# Patient Record
Sex: Female | Born: 1940 | Race: White | Hispanic: No | Marital: Married | Smoking: Never smoker
Health system: Southern US, Community
[De-identification: ages and names within clinical notes are randomized; demographics above are authoritative.]

## PROBLEM LIST (undated history)

## (undated) DIAGNOSIS — K6389 Other specified diseases of intestine: Secondary | ICD-10-CM

## (undated) DIAGNOSIS — K589 Irritable bowel syndrome without diarrhea: Secondary | ICD-10-CM

## (undated) DIAGNOSIS — K5900 Constipation, unspecified: Secondary | ICD-10-CM

## (undated) DIAGNOSIS — Z86010 Personal history of colonic polyps: Secondary | ICD-10-CM

## (undated) DIAGNOSIS — E7800 Pure hypercholesterolemia, unspecified: Secondary | ICD-10-CM

## (undated) DIAGNOSIS — I639 Cerebral infarction, unspecified: Secondary | ICD-10-CM

## (undated) DIAGNOSIS — I10 Essential (primary) hypertension: Secondary | ICD-10-CM

## (undated) DIAGNOSIS — Z800 Family history of malignant neoplasm of digestive organs: Secondary | ICD-10-CM

## (undated) DIAGNOSIS — F329 Major depressive disorder, single episode, unspecified: Secondary | ICD-10-CM

## (undated) DIAGNOSIS — G4730 Sleep apnea, unspecified: Secondary | ICD-10-CM

## (undated) DIAGNOSIS — M159 Polyosteoarthritis, unspecified: Secondary | ICD-10-CM

## (undated) DIAGNOSIS — F3289 Other specified depressive episodes: Secondary | ICD-10-CM

## (undated) HISTORY — DX: Major depressive disorder, single episode, unspecified: F32.9

## (undated) HISTORY — DX: Constipation, unspecified: K59.00

## (undated) HISTORY — DX: Cerebral infarction, unspecified: I63.9

## (undated) HISTORY — DX: Essential (primary) hypertension: I10

## (undated) HISTORY — PX: VAGINAL HYSTERECTOMY: SHX2639

## (undated) HISTORY — PX: CHOLECYSTECTOMY: SHX55

## (undated) HISTORY — DX: Personal history of colonic polyps: Z86.010

## (undated) HISTORY — DX: Irritable bowel syndrome, unspecified: K58.9

## (undated) HISTORY — DX: Sleep apnea, unspecified: G47.30

## (undated) HISTORY — DX: Other specified depressive episodes: F32.89

## (undated) HISTORY — DX: Other specified diseases of intestine: K63.89

## (undated) HISTORY — DX: Pure hypercholesterolemia, unspecified: E78.00

## (undated) HISTORY — DX: Family history of malignant neoplasm of digestive organs: Z80.0

## (undated) HISTORY — PX: COLON RESECTION: SHX5231

## (undated) HISTORY — DX: Polyosteoarthritis, unspecified: M15.9

---

## 1998-08-02 ENCOUNTER — Other Ambulatory Visit: Admission: RE | Admit: 1998-08-02 | Discharge: 1998-08-02 | Payer: Self-pay | Admitting: Gastroenterology

## 1998-08-02 ENCOUNTER — Encounter (INDEPENDENT_AMBULATORY_CARE_PROVIDER_SITE_OTHER): Payer: Self-pay | Admitting: Specialist

## 2001-08-04 ENCOUNTER — Encounter: Admission: RE | Admit: 2001-08-04 | Discharge: 2001-11-02 | Payer: Self-pay | Admitting: Internal Medicine

## 2005-10-14 DIAGNOSIS — Z86010 Personal history of colonic polyps: Secondary | ICD-10-CM

## 2005-10-14 HISTORY — DX: Personal history of colonic polyps: Z86.010

## 2005-10-29 ENCOUNTER — Ambulatory Visit: Payer: Self-pay | Admitting: Gastroenterology

## 2005-10-30 ENCOUNTER — Encounter (INDEPENDENT_AMBULATORY_CARE_PROVIDER_SITE_OTHER): Payer: Self-pay | Admitting: Specialist

## 2005-10-30 ENCOUNTER — Ambulatory Visit: Payer: Self-pay | Admitting: Gastroenterology

## 2005-11-15 ENCOUNTER — Ambulatory Visit: Payer: Self-pay | Admitting: Gastroenterology

## 2005-11-15 LAB — CONVERTED CEMR LAB
ALT: 22 U/L
AST: 27 U/L
Albumin: 4.3 g/dL
Alkaline Phosphatase: 75 U/L
BUN: 11 mg/dL
Basophils Absolute: 0.1 K/uL
Basophils Relative: 1.5 % — ABNORMAL HIGH
CO2: 26 meq/L
Calcium: 9.5 mg/dL
Chloride: 90 meq/L — ABNORMAL LOW
Creatinine, Ser: 0.9 mg/dL
Eosinophil percent: 2.1 %
Folate: 15.9 ng/mL
GFR calc non Af Amer: 67 mL/min
Glomerular Filtration Rate, Af Am: 81 mL/min/1.73m2
Glucose, Bld: 87 mg/dL
HCT: 36.4 %
Hemoglobin: 12.1 g/dL
Lymphocytes Relative: 36.5 %
MCHC: 33.3 g/dL
MCV: 91.9 fL
Monocytes Absolute: 0.2 K/uL
Monocytes Relative: 7.1 %
Neutro Abs: 1.8 K/uL
Neutrophils Relative %: 52.8 %
Platelets: 264 K/uL
Potassium: 4.4 meq/L
RBC: 3.96 M/uL
RDW: 12.9 %
Sed Rate: 8 mm/h
Sodium: 124 meq/L — ABNORMAL LOW
T4, Total: 4.6 ug/dL — ABNORMAL LOW
TSH: 2.04 u[IU]/mL
Total Bilirubin: 0.8 mg/dL
Total Protein: 7.2 g/dL
Vitamin B-12: 691 pg/mL
WBC: 3.5 10*3/microliter — ABNORMAL LOW

## 2005-11-26 ENCOUNTER — Ambulatory Visit: Payer: Self-pay | Admitting: Gastroenterology

## 2005-12-26 ENCOUNTER — Ambulatory Visit: Payer: Self-pay | Admitting: Gastroenterology

## 2006-04-24 ENCOUNTER — Ambulatory Visit: Payer: Self-pay | Admitting: Gastroenterology

## 2006-04-24 LAB — CONVERTED CEMR LAB
ALT: 22 units/L (ref 0–40)
AST: 31 units/L (ref 0–37)
Basophils Relative: 0.2 % (ref 0.0–1.0)
Bilirubin, Direct: 0.1 mg/dL (ref 0.0–0.3)
CO2: 33 meq/L — ABNORMAL HIGH (ref 19–32)
Calcium: 10.2 mg/dL (ref 8.4–10.5)
Chloride: 99 meq/L (ref 96–112)
Eosinophils Relative: 2.1 % (ref 0.0–5.0)
Glucose, Bld: 90 mg/dL (ref 70–99)
HCT: 36.7 % (ref 36.0–46.0)
Lymphocytes Relative: 27.4 % (ref 12.0–46.0)
Neutro Abs: 2.6 10*3/uL (ref 1.4–7.7)
Platelets: 258 10*3/uL (ref 150–400)
RBC: 4.07 M/uL (ref 3.87–5.11)
Total Protein: 7.6 g/dL (ref 6.0–8.3)
WBC: 4.1 10*3/uL — ABNORMAL LOW (ref 4.5–10.5)

## 2006-10-02 ENCOUNTER — Ambulatory Visit: Payer: Self-pay | Admitting: Gastroenterology

## 2007-01-02 DIAGNOSIS — K6389 Other specified diseases of intestine: Secondary | ICD-10-CM

## 2007-01-02 DIAGNOSIS — I10 Essential (primary) hypertension: Secondary | ICD-10-CM | POA: Insufficient documentation

## 2007-01-02 DIAGNOSIS — E7800 Pure hypercholesterolemia, unspecified: Secondary | ICD-10-CM | POA: Insufficient documentation

## 2007-01-02 DIAGNOSIS — F329 Major depressive disorder, single episode, unspecified: Secondary | ICD-10-CM

## 2007-01-02 DIAGNOSIS — D126 Benign neoplasm of colon, unspecified: Secondary | ICD-10-CM

## 2007-01-02 DIAGNOSIS — K589 Irritable bowel syndrome without diarrhea: Secondary | ICD-10-CM

## 2007-01-02 DIAGNOSIS — K5900 Constipation, unspecified: Secondary | ICD-10-CM

## 2007-01-02 DIAGNOSIS — M159 Polyosteoarthritis, unspecified: Secondary | ICD-10-CM | POA: Insufficient documentation

## 2008-10-05 ENCOUNTER — Encounter (INDEPENDENT_AMBULATORY_CARE_PROVIDER_SITE_OTHER): Payer: Self-pay | Admitting: *Deleted

## 2010-05-25 ENCOUNTER — Encounter: Payer: Self-pay | Admitting: Gastroenterology

## 2010-05-25 ENCOUNTER — Other Ambulatory Visit (INDEPENDENT_AMBULATORY_CARE_PROVIDER_SITE_OTHER): Payer: Medicare HMO

## 2010-05-25 ENCOUNTER — Ambulatory Visit (INDEPENDENT_AMBULATORY_CARE_PROVIDER_SITE_OTHER): Payer: Medicare HMO | Admitting: Gastroenterology

## 2010-05-25 VITALS — BP 134/72 | HR 80 | Ht 65.0 in | Wt 135.0 lb

## 2010-05-25 DIAGNOSIS — K5909 Other constipation: Secondary | ICD-10-CM

## 2010-05-25 DIAGNOSIS — D649 Anemia, unspecified: Secondary | ICD-10-CM

## 2010-05-25 DIAGNOSIS — Z79899 Other long term (current) drug therapy: Secondary | ICD-10-CM

## 2010-05-25 DIAGNOSIS — K5904 Chronic idiopathic constipation: Secondary | ICD-10-CM | POA: Insufficient documentation

## 2010-05-25 DIAGNOSIS — Z860100 Personal history of colon polyps, unspecified: Secondary | ICD-10-CM | POA: Insufficient documentation

## 2010-05-25 LAB — IBC PANEL: Transferrin: 252.4 mg/dL (ref 212.0–360.0)

## 2010-05-25 LAB — FERRITIN: Ferritin: 20.5 ng/mL (ref 10.0–291.0)

## 2010-05-25 NOTE — Patient Instructions (Signed)
Call back to schedule your colonoscopy you will need a free pre visit, [phone]. Please go to the basement today for your labs.

## 2010-05-25 NOTE — Progress Notes (Signed)
This is a 70 year old Caucasian female that I have followed for many years because of chronic functional constipation and anorectal motility problems confirmed   by Dr. Darcella Cheshire at [facility]. She continues with chronic functional constipation requiring stool softeners and daily MiraLax. There is no history of melena or hematochezia,  and recent lab data showed a hemoglobin of 11.1. She also denies abdominal pain, upper GI, hepatobiliary complaints. 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 Family history is noncontributory.  Current Medications, Allergies, Past Medical History, Past Surgical History, Family History and Social History were reviewed in [employer] record.  Pertinent Review of Systems Negative   Physical Exam: Awake alert no acute distress. No stigmata of chronic liver disease. Chest is clear cardiac exam is unremarkable. There is no hepatosplenomegaly, abdominal masses or tenderness. Bowel sounds are generally normal. Specks of the rectum shows some redundant skin tissue in the perianal area but no fissures or fistulae. Rectal exam shows no masses, tenderness, or impaction. Stool is normal color and guaiac-negative. Extremities are unremarkable and mental status is clear.    Assessment and Plan: Long history of chronic functional constipation with laxative dependency. She apparently said some type of anorectal surgery for her fecal incontinence. I do not have these records for review. She has mild anemia of unexplained etiology. Anemia profile has been ordered as has celiac profile. We will perform colonoscopy at her convenience followup for colonic polyposis. She is to continue all current medications as listed and reviewed her record.   Past Medical History  Diagnosis Date  . Family history of malignant neoplasm of gastrointestinal tract   . Depressive disorder, not elsewhere classified   . Generalized osteoarthrosis,  unspecified site   . Pure hypercholesterolemia   . Essential hypertension, benign   . Irritable bowel syndrome   . Personal history of colonic polyps 10/2005    Adenomatous  . Other specified disorder of intestines   . Unspecified constipation   . Sleep apnea   . Stroke    Past Surgical History  Procedure Date  . Cholecystectomy   . Vaginal hysterectomy   . Colon resection     reports that she has never smoked. She does not have any smokeless tobacco history on file. She reports that she does not drink alcohol or use illicit drugs. family history includes Colon cancer in her mother and unspecified family member and Irritable bowel syndrome in her mother. Allergies  Allergen Reactions  . Codeine

## 2010-05-28 ENCOUNTER — Encounter: Payer: Self-pay | Admitting: Gastroenterology

## 2010-05-28 LAB — GLIA (IGA/G) + TTG IGA
Gliadin IgA: 2.4 U/mL (ref <20)
Tissue Transglutaminase Ab, IgA: 2.1 U/mL (ref <20)

## 2010-05-29 NOTE — Assessment & Plan Note (Signed)
[facility]                         GASTROENTEROLOGY OFFICE NOTE   NAME:Sherri Dominguez, Sherri Dominguez                        MRN:          [id number]  DATE:10/02/2006                            DOB:          Aug 05, 1940    Sherri Dominguez had some left lower quadrant discomfort and diarrhea, and saw her  primary care physician who treated her for diverticulitis again with  response.  She is now having rather severe constipation, as is her usual  scenario.  She has no nausea or vomiting, fever, chills, melena, or  hematochezia.   As per my previous notes.  She has pelvic floor dysfunction as  documented by Dr. Dorita Fray in [place].  She also has severe  melanosis coli and her last colonoscopy was performed in October of  2007.  She did have an adenomatous polyp removed at that time.  My  report really just documented diverticulosis at that time.   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  I have no records  concerning this hospitalization and she is not on anticoagulants at this  time.   MEDICATIONS:  1. Prozac 20 mg a day.  2. Calcium and magnesium compounds.  3. Flax seed oil.  4. P.r.n. MiraLax.  She is not even on an aspirin tablet, but does      take Vytorin 40 mg a day.   EXAM:  She is awake and alert, in no acute distress.  Cannot appreciate stigmata of chronic liver disease.  She weighs 128 pounds, blood pressure 138/84, pulse 72 and regular.  Her abdominal exam was entirely benign without organomegaly, masses,  tenderness, or distension.  Bowel sounds were normal.  Inspection of the rectum was unremarkable, as was rectal exam with soft  stool present that was very scant and guaiac negative.   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  The rest of her medical history is  somewhat unclear to me, especially her recent  history of a  cerebrovascular accident without any therapy or followup plans.   RECOMMENDATIONS:  1. High fiber diet, daily Benefiber and liberal p.o. fluids.  2. Continue Amitiza 24 mcg twice a day with meals.  3. P.r.n. MiraLax at bedtime.  4. Continue primary care followup with Dr. Kathrynn Speed.     Vania Rea. Jarold Motto, MD, Caleen Essex, FAGA  Electronically Signed    DRP/MedQ  DD: 10/02/2006  DT: 10/02/2006  Job #: [id number]   cc:   Dr. Verdia Kuba

## 2010-06-01 NOTE — Assessment & Plan Note (Signed)
[facility]                         GASTROENTEROLOGY OFFICE NOTE   NAME:Klecker, NATARSHA HURWITZ                        MRN:          [id number]  DATE:04/24/2006                            DOB:          Apr 05, 1940    Brennan continued with constipation made worse by anticholinergics given  to her by her primary care physician for colonic spasms.  She has  rectosigmoid dysfunction and has to manually disimpact herself.  She has  had colonoscopy which showed an adenomatous polyp that was removed this  past fall, also melanosis coli.  She has had no response to Amitiza and  actually is worse with fiber supplements.  She has had no fever, chills,  nausea or vomiting or systemic complaints.  Denies other neuromuscular  problems such as swallowing difficulties or bladder emptying problems.  Despite all of these complaints, her appetite is good and her weight has  been stable.   PHYSICAL EXAMINATION:  VITAL SIGNS:  Actually, her weight today is down  6 pounds 133 pounds and blood pressure 122/88.  ABDOMEN:  Exam was unremarkable without organomegaly, masses or  tenderness.  Bowel sounds were normal.  RECTAL:  Hard impacted stool in the rectal vault that is guaiac  negative.   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   RECOMMENDATIONS:  1. Colonoscopy prepped today with polyethylene glycol solution.  2. Once she is cleaned out, we will start MiraLax 8 ounces on a      regular basis at bedtime with p.r.n. Dulcolax suppository use.  3. I have asked her to decrease the fiber in her diet but to drink      plenty of p.o. fluids.  4. Repeat CBC and electrolyte panel to rule out metabolic disturbance.  5. Will set her up to see Dr. Mechele Claude in [place] for      anorectal manometry to see if she would actually benefit from      biofeedback training for her rectosigmoid dysfunction.     Vania Rea. Jarold Motto,  MD, Caleen Essex, FAGA  Electronically Signed    DRP/MedQ  DD: 04/24/2006  DT: 04/24/2006  Job #: [id number]   cc:   Mechele Claude

## 2010-06-01 NOTE — Assessment & Plan Note (Signed)
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  She has had  chronic irritable bowel syndrome for many years, and has been evaluated in  our office in the past.  Has had colonoscopy on several occasions, last  performed in July 2000.  At that time she had rather marked melanosis coli  confirmed by colon biopsies.   She has really done fairly well over the last year, but has had recent  change in her stool.  She has had flat, ribbon-shaped stools with some  abdominal gas and bloating, but no real melena or hematochezia.  She denies  upper GI or hepatobiliary complaints.  She has tried a variety of different  diets including low roughage, avoidance of gas producing substances without  improvement.  She has had no anorexia or weight loss.  She does have a  family history of colon cancer in her mother and an uncle.   She denies any specific food intolerances such as lactose, does not abuse  sorbitol or fructose.  She has had no fever, chills, skin rashes, joint  pains or systemic complaints.   PAST MEDICAL HISTORY:  1. Hypertension for the last 2 years.  2. Hypercholesterolemia.  3. Degenerative arthritis.  4. She apparently, this last January, had a cholecystectomy for      nonfunctioning gallbladder.  5. Has had recent gynecologic surgery for what sounds like a prolapsed      bladder.  6. She had a hysterectomy some 30 years ago.  Her gynecologist is Dr. Shawnie Pons      in [facility].   MEDICATIONS:  1. Vytorin 40 mg a day.  2. Tenoretic 100/25 mg a day.  3. Allegra 180 mg a day.  4. Prozac 20 mg a day.  5. Ambien 10 mg at night.  6. Calcium and magnesium with zinc daily.  7. Flax seed oil.  8. Multivitamins daily.   She  has had previous reactions to an unknown antibiotic.   FAMILY HISTORY:  Otherwise noncontributory, but is markedly positive for  colon cancer in her mother and uncle.   SOCIAL HISTORY:  She is married and lives with her husband.  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  She does not smoke or abuse ethanol.   REVIEW OF SYMPTOMS:  Noncontributory.  She denies current cardiovascular or  pulmonary, gynecologic or neurologic or psychiatric difficulties.   EXAMINATION:  GENERAL:  She is a healthy appearing white female appearing  younger than her stated age.  VITAL SIGNS:  She is 5 feet 6 inches tall and weighs 138 pounds.  Blood  pressure is 120/82 and pulse is 60 and regular.  ABDOMEN:  I could not appreciate stigmata or chronic liver disease.  There  was no hepatosplenomegaly, abdominal masses or  tenderness.  Bowel sounds  were normal.  NECK:  No thyromegaly.  CHEST:  Clear to percussion and auscultation.  CARDIAC:  I could not appreciate murmurs, gallops or rubs.  She appeared to  be in a regular rhythm.  EXTREMITIES:  Extremities were unremarkable.  NEUROLOGIC:  Mental status was clear.  RECTAL:  Inspection of the rectum was normal as was rectal exam.  Stool was  guaiac negative.   ASSESSMENT:  1. Change in bowel habits, probably related to diverticulosis and sigmoid      colon spasm.  With her strong family history of colon cancer, she,      however, needs followup colonoscopy to exclude obstructive lesion.  2. Well-controlled hypertension and hypercholesterolemia.  3. History of degenerative arthritis.  4. Chronic depression with Prozac therapy.   RECOMMENDATIONS:  1. Outpatient colonoscopy exam.  2. Consider treating for bacterial overgrowth syndrome.  3. Laboratory parameters include CBC, metabolic profile, B-12, folate, sed      rate and thyroid function test.  4. Other medications as per her primary care  physician.       Vania Rea. Jarold Motto, MD, Clementeen Graham, [place]      DRP/MedQ  DD:  10/29/2005  DT:  10/29/2005  Job #:  [id number]   cc:   Dell Ponto, MD

## 2010-06-01 NOTE — Assessment & Plan Note (Signed)
[facility]                         GASTROENTEROLOGY OFFICE NOTE   SHARALEE, WITMAN                          MRN:          [id number]  DATE:12/26/2005                            DOB:          1940/03/12    Dyneshia is remarkably better on Amitiza. However, she has had problems  affording this medication. Placed on Altace 10 mg a day for  hypertension, and her blood pressure today is 120/90, pulse 68, weight  139 pounds. Abdominal exam was entirely unremarkable. There was no  distention, and bowel sounds were normal.   I reviewed Rosemary's colonoscopy findings with her, and she had rather  marked melanosis coli with an adenomatous polyp removed from the left  colon.   I have given her samples of Amitiza and have asked her to use daily  psylliium which she has samples of. She can use MiraLax on a p.r.n.  basis, but I have asked her to try Amitiza 24 mcg 1-2 times a day as  needed for chronic constipation.   ADDENDUM   Repeat lab data on November 26, 2005 showed that her previous rather  marked hyponatremia had corrected serum sodium 131, potassium 4.3,  chloride 95, and CO2 31.     Vania Rea. Jarold Motto, MD, Caleen Essex, FAGA  Electronically Signed    DRP/MedQ  DD: 12/26/2005  DT: 12/26/2005  Job #: [id number]   cc:   Halford Decamp, M.D.

## 2010-06-01 NOTE — Assessment & Plan Note (Signed)
[facility]                           GASTROENTEROLOGY OFFICE NOTE   NAME:Markovic, SHAUN ZUCCARO                        MRN:          [id number]  DATE:11/26/2005                            DOB:          1940/06/29    Sherri Dominguez is much better at this time with her constipation taking MiraLax at  bedtime and she apparently also is taking a substance called Colon  Cleanse.  Her colonoscopy recently showed melanosis coli and there was an  adenomatous cecal polyp that was removed.  I have advised that she have  followup colonoscopy in 3 years' time.   Her laboratory data showed rather marked hyponatremia with serum sodium of  124.  I have discontinued her Tenoretic and have restricted her fluid intake  to 1500 mL a day.  We will repeat her electrolyte profile at this time.   She does have a history of essential hypertension and her blood pressure  today is 142/80.  Physical exam otherwise was not performed.   ASSESSMENT:  1. Chronic functional constipation with melanosis coli.  2. Adenomatous colon polyps.  3. Hyponatremia, probably from diuretic use and excessive free water      intake.  4. Chronic depression with Prozac therapy.   RECOMMENDATIONS:  1. Continue high fiber diet, liberal p.o. fluids, and p.r.n. MiraLax.  2. Trial of Amitiza 24 mcg twice a day with meals.  3. Repeat renal profile.  If she is still hyponatremic she will need      further endocrine workup.  4. Placed her on Altace 10 mg a day for her hypertension.  5. Continue other medications as per Dr. Kathrynn Speed.  6. GI followup in 1 month's time.     Sherri Rea. Jarold Motto, MD, Caleen Essex, FAGA  Electronically Signed    DRP/MedQ  DD: 11/26/2005  DT: 11/26/2005  Job #: [id number]   cc:   Dell Ponto, M.D.

## 2010-06-04 ENCOUNTER — Encounter: Payer: Self-pay | Admitting: Gastroenterology

## 2011-01-29 ENCOUNTER — Encounter: Payer: Self-pay | Admitting: Gastroenterology

## 2011-10-07 ENCOUNTER — Encounter: Payer: Self-pay | Admitting: Gastroenterology

## 2013-08-12 ENCOUNTER — Encounter: Payer: Self-pay | Admitting: Gastroenterology

## 2017-08-09 ENCOUNTER — Emergency Department (HOSPITAL_COMMUNITY): Payer: Medicare Other

## 2017-08-09 ENCOUNTER — Encounter (HOSPITAL_COMMUNITY): Payer: Self-pay

## 2017-08-09 ENCOUNTER — Other Ambulatory Visit: Payer: Self-pay

## 2017-08-09 ENCOUNTER — Observation Stay (HOSPITAL_COMMUNITY)
Admission: EM | Admit: 2017-08-09 | Discharge: 2017-08-11 | Disposition: A | Discharge status: Home / Self Care | Payer: Medicare Other | Attending: Internal Medicine | Admitting: Internal Medicine

## 2017-08-09 DIAGNOSIS — K922 Gastrointestinal hemorrhage, unspecified: Secondary | ICD-10-CM | POA: Diagnosis not present

## 2017-08-09 DIAGNOSIS — K920 Hematemesis: Principal | ICD-10-CM

## 2017-08-09 DIAGNOSIS — K5900 Constipation, unspecified: Secondary | ICD-10-CM | POA: Diagnosis present

## 2017-08-09 DIAGNOSIS — Z800 Family history of malignant neoplasm of digestive organs: Secondary | ICD-10-CM | POA: Diagnosis not present

## 2017-08-09 DIAGNOSIS — R109 Unspecified abdominal pain: Secondary | ICD-10-CM | POA: Diagnosis present

## 2017-08-09 DIAGNOSIS — G4730 Sleep apnea, unspecified: Secondary | ICD-10-CM | POA: Insufficient documentation

## 2017-08-09 DIAGNOSIS — N390 Urinary tract infection, site not specified: Secondary | ICD-10-CM | POA: Diagnosis not present

## 2017-08-09 DIAGNOSIS — Z79899 Other long term (current) drug therapy: Secondary | ICD-10-CM | POA: Insufficient documentation

## 2017-08-09 DIAGNOSIS — R1012 Left upper quadrant pain: Secondary | ICD-10-CM | POA: Diagnosis not present

## 2017-08-09 DIAGNOSIS — Z86010 Personal history of colonic polyps: Secondary | ICD-10-CM | POA: Diagnosis not present

## 2017-08-09 DIAGNOSIS — F32A Depression, unspecified: Secondary | ICD-10-CM | POA: Diagnosis present

## 2017-08-09 DIAGNOSIS — R10819 Abdominal tenderness, unspecified site: Secondary | ICD-10-CM | POA: Diagnosis present

## 2017-08-09 DIAGNOSIS — F0390 Unspecified dementia without behavioral disturbance: Secondary | ICD-10-CM | POA: Insufficient documentation

## 2017-08-09 DIAGNOSIS — I10 Essential (primary) hypertension: Secondary | ICD-10-CM | POA: Diagnosis not present

## 2017-08-09 DIAGNOSIS — F329 Major depressive disorder, single episode, unspecified: Secondary | ICD-10-CM | POA: Diagnosis not present

## 2017-08-09 DIAGNOSIS — R8290 Unspecified abnormal findings in urine: Secondary | ICD-10-CM | POA: Diagnosis present

## 2017-08-09 DIAGNOSIS — Z9049 Acquired absence of other specified parts of digestive tract: Secondary | ICD-10-CM | POA: Insufficient documentation

## 2017-08-09 DIAGNOSIS — E875 Hyperkalemia: Secondary | ICD-10-CM | POA: Diagnosis present

## 2017-08-09 DIAGNOSIS — E785 Hyperlipidemia, unspecified: Secondary | ICD-10-CM | POA: Insufficient documentation

## 2017-08-09 DIAGNOSIS — Z8673 Personal history of transient ischemic attack (TIA), and cerebral infarction without residual deficits: Secondary | ICD-10-CM | POA: Insufficient documentation

## 2017-08-09 LAB — COMPREHENSIVE METABOLIC PANEL
ALBUMIN: 3.9 g/dL (ref 3.5–5.0)
ALT: 12 U/L (ref 0–44)
AST: 26 U/L (ref 15–41)
Alkaline Phosphatase: 78 U/L (ref 38–126)
Anion gap: 12 (ref 5–15)
BUN: 13 mg/dL (ref 8–23)
CHLORIDE: 100 mmol/L (ref 98–111)
CO2: 20 mmol/L — AB (ref 22–32)
Calcium: 9.3 mg/dL (ref 8.9–10.3)
Creatinine, Ser: 0.7 mg/dL (ref 0.44–1.00)
GFR calc Af Amer: >60 mL/min (ref >60)
Glucose, Bld: 96 mg/dL (ref 70–99)
POTASSIUM: 5.2 mmol/L — AB (ref 3.5–5.1)
SODIUM: 132 mmol/L — AB (ref 135–145)
Total Bilirubin: 0.7 mg/dL (ref 0.3–1.2)
Total Protein: 7.9 g/dL (ref 6.5–8.1)

## 2017-08-09 LAB — BASIC METABOLIC PANEL
ANION GAP: 10 (ref 5–15)
BUN: 11 mg/dL (ref 8–23)
CO2: 20 mmol/L — ABNORMAL LOW (ref 22–32)
Calcium: 9.2 mg/dL (ref 8.9–10.3)
Chloride: 104 mmol/L (ref 98–111)
Creatinine, Ser: 0.59 mg/dL (ref 0.44–1.00)
GFR calc Af Amer: >60 mL/min (ref >60)
GLUCOSE: 97 mg/dL (ref 70–99)
POTASSIUM: 4.3 mmol/L (ref 3.5–5.1)
Sodium: 134 mmol/L — ABNORMAL LOW (ref 135–145)

## 2017-08-09 LAB — I-STAT CG4 LACTIC ACID, ED: Lactic Acid, Venous: 1.52 mmol/L (ref 0.5–1.9)

## 2017-08-09 LAB — URINALYSIS, ROUTINE W REFLEX MICROSCOPIC

## 2017-08-09 LAB — I-STAT TROPONIN, ED: Troponin i, poc: 0.01 ng/mL (ref 0.00–0.08)

## 2017-08-09 LAB — URINALYSIS, MICROSCOPIC (REFLEX)

## 2017-08-09 LAB — TYPE AND SCREEN
ABO/RH(D): A POS
Antibody Screen: NEGATIVE

## 2017-08-09 LAB — CBC
HEMATOCRIT: 36.2 % (ref 36.0–46.0)
Hemoglobin: 12.1 g/dL (ref 12.0–15.0)
MCH: 30.2 pg (ref 26.0–34.0)
MCHC: 33.4 g/dL (ref 30.0–36.0)
MCV: 90.3 fL (ref 78.0–100.0)
Platelets: 280 10*3/uL (ref 150–400)
RBC: 4.01 MIL/uL (ref 3.87–5.11)
RDW: 14.9 % (ref 11.5–15.5)
WBC: 10.1 10*3/uL (ref 4.0–10.5)

## 2017-08-09 LAB — LIPASE, BLOOD: LIPASE: 34 U/L (ref 11–51)

## 2017-08-09 LAB — POC OCCULT BLOOD, ED: FECAL OCCULT BLD: NEGATIVE

## 2017-08-09 MED ORDER — VITAMIN B-12 1000 MCG PO TABS: 1000.0000 ug | ORAL_TABLET | Freq: Every day | ORAL | Status: DC

## 2017-08-09 MED ORDER — ATORVASTATIN CALCIUM 40 MG PO TABS
80.0000 mg | ORAL_TABLET | Freq: Every day | ORAL | Status: DC
  Administered 2017-08-09 – 2017-08-11 (×3): 80 mg via ORAL
  Filled 2017-08-09: qty 2
  Filled 2017-08-09 (×2): qty 1
  Filled 2017-08-09: qty 2

## 2017-08-09 MED ORDER — ACETAMINOPHEN 650 MG RE SUPP: 650.0000 mg | Freq: Four times a day (QID) | RECTAL | Status: DC | PRN

## 2017-08-09 MED ORDER — SODIUM CHLORIDE 0.9% FLUSH
3.0000 mL | INTRAVENOUS | Status: DC | PRN
  Administered 2017-08-11: 3 mL via INTRAVENOUS
  Filled 2017-08-09: qty 3

## 2017-08-09 MED ORDER — ICAPS PO CAPS: ORAL_CAPSULE | Freq: Every day | ORAL | Status: DC

## 2017-08-09 MED ORDER — DONEPEZIL HCL 5 MG PO TABS
5.0000 mg | ORAL_TABLET | Freq: Two times a day (BID) | ORAL | Status: DC
  Administered 2017-08-09 – 2017-08-11 (×4): 5 mg via ORAL
  Filled 2017-08-09 (×4): qty 1

## 2017-08-09 MED ORDER — LORATADINE 10 MG PO TABS
10.0000 mg | ORAL_TABLET | Freq: Every day | ORAL | Status: DC
  Administered 2017-08-10 – 2017-08-11 (×2): 10 mg via ORAL
  Filled 2017-08-09 (×2): qty 1

## 2017-08-09 MED ORDER — MAGNESIUM 30 MG PO TABS: 30.0000 mg | ORAL_TABLET | Freq: Every day | ORAL | Status: DC

## 2017-08-09 MED ORDER — CALCIUM CARBONATE ANTACID 500 MG PO CHEW
1.0000 | CHEWABLE_TABLET | Freq: Every day | ORAL | Status: DC
  Administered 2017-08-10 – 2017-08-11 (×2): 200 mg via ORAL
  Filled 2017-08-09 (×2): qty 1

## 2017-08-09 MED ORDER — ONDANSETRON HCL 4 MG/2ML IJ SOLN
4.0000 mg | Freq: Once | INTRAMUSCULAR | Status: AC
  Administered 2017-08-09: 4 mg via INTRAVENOUS
  Filled 2017-08-09: qty 2

## 2017-08-09 MED ORDER — PROSIGHT PO TABS
1.0000 | ORAL_TABLET | Freq: Every day | ORAL | Status: DC
  Administered 2017-08-10 – 2017-08-11 (×2): 1 via ORAL
  Filled 2017-08-09 (×2): qty 1

## 2017-08-09 MED ORDER — ACETAMINOPHEN 325 MG PO TABS
650.0000 mg | ORAL_TABLET | Freq: Four times a day (QID) | ORAL | Status: DC | PRN
  Administered 2017-08-09 – 2017-08-10 (×2): 650 mg via ORAL
  Filled 2017-08-09 (×2): qty 2

## 2017-08-09 MED ORDER — SODIUM CHLORIDE 0.9% FLUSH
3.0000 mL | Freq: Two times a day (BID) | INTRAVENOUS | Status: DC
  Administered 2017-08-09 – 2017-08-11 (×3): 3 mL via INTRAVENOUS

## 2017-08-09 MED ORDER — CHOLECALCIFEROL 125 MCG (5000 UT) PO CAPS: 5000.0000 [IU] | ORAL_CAPSULE | Freq: Every day | ORAL | Status: DC

## 2017-08-09 MED ORDER — PANTOPRAZOLE SODIUM 40 MG PO TBEC
40.0000 mg | DELAYED_RELEASE_TABLET | Freq: Every day | ORAL | Status: DC
  Administered 2017-08-09: 40 mg via ORAL
  Filled 2017-08-09: qty 1

## 2017-08-09 MED ORDER — METOPROLOL SUCCINATE ER 50 MG PO TB24
50.0000 mg | ORAL_TABLET | Freq: Every day | ORAL | Status: DC
  Administered 2017-08-09 – 2017-08-11 (×3): 50 mg via ORAL
  Filled 2017-08-09 (×3): qty 1

## 2017-08-09 MED ORDER — OXYBUTYNIN CHLORIDE 5 MG PO TABS: 5.0000 mg | ORAL_TABLET | Freq: Every day | ORAL | Status: DC

## 2017-08-09 MED ORDER — FAMOTIDINE IN NACL 20-0.9 MG/50ML-% IV SOLN
20.0000 mg | Freq: Once | INTRAVENOUS | Status: AC
  Administered 2017-08-09: 20 mg via INTRAVENOUS
  Filled 2017-08-09: qty 50

## 2017-08-09 MED ORDER — SODIUM CHLORIDE 0.9 % IV SOLN
Freq: Once | INTRAVENOUS | Status: AC
  Administered 2017-08-09: 14:00:00 via INTRAVENOUS

## 2017-08-09 MED ORDER — MAGNESIUM GLUCONATE 500 MG PO TABS
500.0000 mg | ORAL_TABLET | Freq: Every day | ORAL | Status: DC
  Administered 2017-08-10 – 2017-08-11 (×2): 500 mg via ORAL
  Filled 2017-08-09 (×2): qty 1

## 2017-08-09 MED ORDER — ONDANSETRON HCL 4 MG PO TABS: 4.0000 mg | ORAL_TABLET | Freq: Four times a day (QID) | ORAL | Status: DC | PRN

## 2017-08-09 MED ORDER — AMLODIPINE BESYLATE 5 MG PO TABS
5.0000 mg | ORAL_TABLET | Freq: Every day | ORAL | Status: DC
  Administered 2017-08-09 – 2017-08-11 (×3): 5 mg via ORAL
  Filled 2017-08-09 (×3): qty 1

## 2017-08-09 MED ORDER — SODIUM CHLORIDE 0.9 % IV SOLN: 250.0000 mL | INTRAVENOUS | Status: DC | PRN

## 2017-08-09 MED ORDER — CALCIUM CARBONATE 200 MG PO CAPS: 250.0000 mg | ORAL_CAPSULE | Freq: Every day | ORAL | Status: DC

## 2017-08-09 MED ORDER — TAMSULOSIN HCL 0.4 MG PO CAPS: 0.4000 mg | ORAL_CAPSULE | Freq: Every day | ORAL | Status: DC

## 2017-08-09 MED ORDER — ONDANSETRON HCL 4 MG/2ML IJ SOLN: 4.0000 mg | Freq: Four times a day (QID) | INTRAMUSCULAR | Status: DC | PRN

## 2017-08-09 MED ORDER — IOPAMIDOL (ISOVUE-300) INJECTION 61%
INTRAVENOUS | Status: AC
  Administered 2017-08-09: 100 mL
  Filled 2017-08-09: qty 100

## 2017-08-09 MED ORDER — FLUOXETINE HCL 20 MG PO CAPS
20.0000 mg | ORAL_CAPSULE | Freq: Every day | ORAL | Status: DC
  Administered 2017-08-10 – 2017-08-11 (×2): 20 mg via ORAL
  Filled 2017-08-09 (×3): qty 1

## 2017-08-09 MED ORDER — MORPHINE SULFATE (PF) 4 MG/ML IV SOLN
4.0000 mg | Freq: Once | INTRAVENOUS | Status: AC
  Administered 2017-08-09: 4 mg via INTRAVENOUS
  Filled 2017-08-09: qty 1

## 2017-08-09 NOTE — ED Notes (Addendum)
Patient ambulatory to restroom without difficulty. Steady gait with stand by assistance. Oxygen saturation remained at 95% during ambulation. Urine was red in color. PA made aware.

## 2017-08-09 NOTE — ED Notes (Signed)
Per CT, IV blew during scan.

## 2017-08-09 NOTE — ED Notes (Signed)
Patient transported to CT 

## 2017-08-09 NOTE — ED Notes (Signed)
Patient's sister-in-law, Santiago Glad, can be reached at [phone].

## 2017-08-09 NOTE — H&P (View-Only) (Signed)
[facility] Consult Note   History Sherri Dominguez MRN # [id number]  Date of Admission: 08/09/2017 Date of Consultation: 08/09/2017 Referring physician: Dr. Debbe Odea, MD Primary Care Provider: Lorelei Pont, MD Primary Gastroenterologist: [facility], previously Dr. 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  She is a somewhat inconsistent historian, but reports at least several months of frequent left upper quadrant pain that is nonradiating with no clear triggers.  She also has an episode of vomiting about twice a week.  Yesterday today those episodes of vomiting reportedly had red blood.  She has seen no blood per rectum or black tarry stool.  She denies dysphagia dysphagia, and think she may have lost 4 pounds in the last month or 2.  She points to the left upper quadrant under the rib cage as the exact spot of the pain.  Earlier today she also had hematuria that she reported to the ED physician, but when brought her attention, she did not recall that.  Her last known colonoscopy with [facility] was in 2007, no upper endoscopy on file.  She carries a diagnosis of constipation type IBS.  ROS:  Constitutional: No fever or chills  Musculoskeletal:   Intermittent low back pain Urinary: Hematuria as noted above Psychiatric:   Dysphoric mood at times.  She reports losing her husband this year and also 1 of her sons this year.  She says another son died previously from cancer.  She lives alone with extended family members in the area.  All other systems are negative except as noted above in the HPI  Past Medical History Past Medical History:  Diagnosis Date  . Depressive disorder, not elsewhere classified   . Essential  hypertension, benign   . Family history of malignant neoplasm of gastrointestinal tract   . Generalized osteoarthrosis, unspecified site   . Irritable bowel syndrome   . Other specified disorder of intestines   . 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 Pure hypercholesterolemia   . Sleep apnea   . Stroke ([place])   . Unspecified constipation     Past Surgical History Past Surgical History:  Procedure Laterality Date  . CHOLECYSTECTOMY    . COLON RESECTION    . VAGINAL HYSTERECTOMY      Family History Family History  Problem Relation Age of Onset  . Colon cancer Mother        mets  . Irritable bowel syndrome Mother   . Colon cancer Unknown        uncle    Social History Social History   Socioeconomic History  . Marital status: Married    Spouse name: Not on file  . Number of children: 2  . Years of education: Not on file  . Highest education level: Not on file  Occupational History  . Occupation: Retired  Scientific laboratory technician  . Financial resource strain: Not on file  . Food insecurity:    Worry: Not on file    Inability: Not on file  . Transportation needs:    Medical: Not on file    Non-medical: Not on file  Tobacco Use  . Smoking status: Never Smoker  Substance and Sexual Activity  . Alcohol use: No  . Drug use: No  .  Sexual activity: Not on file  Lifestyle  . Physical activity:    Days per week: Not on file    Minutes per session: Not on file  . Stress: Not on file  Relationships  . Social connections:    Talks on phone: Not on file    Gets together: Not on file    Attends religious service: Not on file    Active member of club or organization: Not on file    Attends meetings of clubs or organizations: Not on file    Relationship status: Not on file  Other Topics Concern  . Not on file  Social History Narrative  . Not on file    Allergies Allergies  Allergen Reactions  . Codeine Other (See Comments)    Pt does not remember  what happens when taking this    Outpatient Meds Home medications from the H+P and/or nursing med reconciliation reviewed.  Inpatient med list reviewed  _____________________________________________________________________ Objective   Exam:  Current vital signs  Patient Vitals for the past 8 hrs:  BP Temp Temp src Pulse Resp SpO2  08/09/17 1600 (!) 150/76 - - (!) 59 (!) 21 94 %  08/09/17 1538 (!) 176/91 98 F (36.7 C) Oral 60 18 95 %  08/09/17 1413 - 98.7 F (37.1 C) Rectal - - -  08/09/17 1400 (!) 156/95 - - 65 20 100 %  08/09/17 1140 (!) 176/101 97.9 F (36.6 C) Oral 65 18 96 %   No intake or output data in the 24 hours ending 08/09/17 1729  Physical Exam:    General: this is a pleasant and well-appearing elderly woman  in no acute distress  Eyes: sclera anicteric, no redness  ENT: oral mucosa moist without lesions, no cervical or supraclavicular lymphadenopathy, good dentition  CV: RRR without murmur, S1/S2, no JVD,, no peripheral edema  Resp: clear to auscultation bilaterally, normal RR and effort noted  GI: soft, mild LUQ tenderness, with active bowel sounds.  The pain and tenderness are directly over a laparoscopy scar.  She has an additional laparoscopy scar just below the umbilicus.. No guarding or palpable organomegaly noted  Skin; warm and dry, no rash or jaundice noted  Neuro: awake, alert and oriented x 3. Normal gross motor function and fluent speech.  Labs:  Recent Labs  Lab 08/09/17 1235  WBC 10.1  HGB 12.1  HCT 36.2  PLT 280   Recent Labs  Lab 08/09/17 1235  NA 132*  K 5.2*  CL 100  CO2 20*  BUN 13  ALBUMIN 3.9  ALKPHOS 78  ALT 12  AST 26  GLUCOSE 96   No results for input(s): INR in the last 168 hours.  Radiologic studies:  CT abdomen and pelvis shows thickening possible inflammation of the bladder dome.  Nonspecific biliary ductal dilatation @ASSESSMENTPLANBEGIN @ Impression:  Left upper quadrant pain, episodic and  chronic for at least several months, history limited  Reported hematemesis, though it is not entirely clear if this history is accurate.  She looks well, stool was reportedly heme negative, normal hemoglobin and no witnessed hematemesis since admission.  No hypotension, certainly no evidence of ongoing overt or brisk GI bleeding.  Hematuria and abnormality of the bladder down on imaging.  [facility] hospitalist is admitting and plans to consult urology.  Plan:  I offered her an upper endoscopy tomorrow for her upper abdominal pain and reported hematemesis.  I described the procedure in detail along with risks and benefits and the presence  of the [facility] hospitalist.  Sundae was agreeable.  Despite some memory difficulty, I feel she is will to give informed consent for procedure.  The benefits and risks of the planned procedure were described in detail with the patient or (when appropriate) their health care proxy.  Risks were outlined as including, but not limited to, bleeding, infection, perforation, adverse medication reaction leading to cardiac or pulmonary decompensation, or pancreatitis (if ERCP).  The limitation of incomplete mucosal visualization was also discussed.  No guarantees or warranties were given.   Upper endoscopy is scheduled for tomorrow.  Further plans pending results.  Thank you for the courtesy of this consult.  Please contact me with any questions or concerns.  Nelida Meuse III Office: [phone]

## 2017-08-09 NOTE — ED Notes (Signed)
Pt ambulated to bathroom without difficulty. Steady gait and standby assistance.

## 2017-08-09 NOTE — ED Notes (Signed)
ED TO INPATIENT HANDOFF REPORT  Name/Age/Gender Sherri Dominguez 77 y.o. female  Code Status Advance Directive Documentation     Most Recent Value  Type of Advance Directive  Living will, Healthcare Power of Attorney  Pre-existing out of facility DNR order (yellow form or pink MOST form)  -  "MOST" Form in Place?  -      Home/SNF/Other Home  Chief Complaint abd pain, vomiting blood  Level of Care/Admitting Diagnosis ED Disposition    ED Disposition Condition [name] [[id number]]  Level of Care: Med-Surg [16]  Diagnosis: Hematemesis [578.0.ICD-9-CM]  Admitting Physician: Debbe Odea [[id number]]  Attending Physician: Debbe Odea [[id number]]  Estimated length of stay: past midnight tomorrow  Certification:: I certify this patient will need inpatient services for at least 2 midnights  PT Class (Do Not Modify): Inpatient [101]  PT Acc Code (Do Not Modify): Private [1]       Medical History Past Medical History:  Diagnosis Date  . Depressive disorder, not elsewhere classified   . Essential hypertension, benign   . Family history of malignant neoplasm of gastrointestinal tract   . Generalized osteoarthrosis, unspecified site   . Irritable bowel syndrome   . Other specified disorder of intestines   . 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 Pure hypercholesterolemia   . Sleep apnea   . Stroke ([place])   . Unspecified constipation     Allergies Allergies  Allergen Reactions  . Codeine Other (See Comments)    Pt does not remember what happens when taking this    IV Location/Drains/Wounds Patient Lines/Drains/Airways Status   Active Line/Drains/Airways    Name:   Placement date:   Placement time:   Site:   Days:   Peripheral IV 08/09/17 Right Antecubital   08/09/17    1456    Antecubital   less than 1          Labs/Imaging Results for orders placed or performed during the hospital encounter of  08/09/17 (from the past 48 hour(s))  Lipase, blood     Status: None   Collection Time: 08/09/17 12:35 PM  Result Value Ref Range   Lipase 34 11 - 51 U/L    Comment: Performed at [facility], [place] [address], [place], [place] [address]  Comprehensive metabolic panel     Status: Abnormal   Collection Time: 08/09/17 12:35 PM  Result Value Ref Range   Sodium 132 (L) 135 - 145 mmol/L   Potassium 5.2 (H) 3.5 - 5.1 mmol/L   Chloride 100 98 - 111 mmol/L   CO2 20 (L) 22 - 32 mmol/L   Glucose, Bld 96 70 - 99 mg/dL   BUN 13 8 - 23 mg/dL   Creatinine, Ser 0.70 0.44 - 1.00 mg/dL   Calcium 9.3 8.9 - 10.3 mg/dL   Total Protein 7.9 6.5 - 8.1 g/dL   Albumin 3.9 3.5 - 5.0 g/dL   AST 26 15 - 41 U/L   ALT 12 0 - 44 U/L   Alkaline Phosphatase 78 38 - 126 U/L   Total Bilirubin 0.7 0.3 - 1.2 mg/dL   GFR calc non Af Amer >60 >60 mL/min   GFR calc Af Amer >60 >60 mL/min    Comment: (NOTE) The eGFR has been calculated using the CKD EPI equation. This calculation has not been validated in all clinical situations. eGFR's persistently <60 mL/min signify possible Chronic Kidney Disease.  Anion gap 12 5 - 15    Comment: Performed at [facility], [place] [address] [place], [place] [address]  CBC     Status: None   Collection Time: 08/09/17 12:35 PM  Result Value Ref Range   WBC 10.1 4.0 - 10.5 K/uL   RBC 4.01 3.87 - 5.11 MIL/uL   Hemoglobin 12.1 12.0 - 15.0 g/dL   HCT 36.2 36.0 - 46.0 %   MCV 90.3 78.0 - 100.0 fL   MCH 30.2 26.0 - 34.0 pg   MCHC 33.4 30.0 - 36.0 g/dL   RDW 14.9 11.5 - 15.5 %   Platelets 280 150 - 400 K/uL    Comment: Performed at [facility], [place] [address], [place], [place] [address]  Urinalysis, Routine w reflex microscopic     Status: Abnormal   Collection Time: 08/09/17  1:05 PM  Result Value Ref Range   Color, Urine RED (A) YELLOW    Comment: BIOCHEMICALS MAY BE AFFECTED BY COLOR CORRECTED ON 07/27 AT 1354: PREVIOUSLY  REPORTED AS RED    APPearance CLOUDY (A) CLEAR   Specific Gravity, Urine  1.005 - 1.030    TEST NOT REPORTED DUE TO COLOR INTERFERENCE OF URINE PIGMENT   pH  5.0 - 8.0    TEST NOT REPORTED DUE TO COLOR INTERFERENCE OF URINE PIGMENT   Glucose, UA (A) NEGATIVE mg/dL    TEST NOT REPORTED DUE TO COLOR INTERFERENCE OF URINE PIGMENT   Hgb urine dipstick (A) NEGATIVE    TEST NOT REPORTED DUE TO COLOR INTERFERENCE OF URINE PIGMENT   Bilirubin Urine (A) NEGATIVE    TEST NOT REPORTED DUE TO COLOR INTERFERENCE OF URINE PIGMENT   Ketones, ur (A) NEGATIVE mg/dL    TEST NOT REPORTED DUE TO COLOR INTERFERENCE OF URINE PIGMENT   Protein, ur (A) NEGATIVE mg/dL    TEST NOT REPORTED DUE TO COLOR INTERFERENCE OF URINE PIGMENT   Nitrite (A) NEGATIVE    TEST NOT REPORTED DUE TO COLOR INTERFERENCE OF URINE PIGMENT   Leukocytes, UA (A) NEGATIVE    TEST NOT REPORTED DUE TO COLOR INTERFERENCE OF URINE PIGMENT    Comment: Performed at [facility], [place] [address], [place], [place] [address]  Urinalysis, Microscopic (reflex)     Status: Abnormal   Collection Time: 08/09/17  1:05 PM  Result Value Ref Range   RBC / HPF 0-5 0 - 5 RBC/hpf   WBC, UA 0-5 0 - 5 WBC/hpf   Bacteria, UA RARE (A) NONE SEEN   Squamous Epithelial / LPF 0-5 0 - 5    Comment: Performed at [facility], [place] [address], [place], [place] [address]  I-Stat CG4 Lactic Acid, ED     Status: None   Collection Time: 08/09/17  2:04 PM  Result Value Ref Range   Lactic Acid, Venous 1.52 0.5 - 1.9 mmol/L  Type and screen [place]     Status: None   Collection Time: 08/09/17  2:05 PM  Result Value Ref Range   ABO/RH(D) A POS    Antibody Screen NEG    Sample Expiration      08/12/2017 Performed at [facility], [place] [address], [place], [place] [address]   I-Stat Troponin, ED (not at [facility])     Status: None   Collection Time: 08/09/17  2:11 PM  Result Value Ref Range    Troponin i, poc 0.01 0.00 - 0.08 ng/mL  Comment 3            Comment: Due to the release kinetics of cTnI, a negative result within the first hours of the onset of symptoms does not rule out myocardial infarction with certainty. If myocardial infarction is still suspected, repeat the test at appropriate intervals.   POC occult blood, ED     Status: None   Collection Time: 08/09/17  3:37 PM  Result Value Ref Range   Fecal Occult Bld NEGATIVE NEGATIVE   Dg Chest 2 View  Result Date: 08/09/2017 CLINICAL DATA:  Abdominal and chest pain EXAM: CHEST - 2 VIEW COMPARISON:  10/21/2016 FINDINGS: Cardiac shadows within normal limits. The lungs are well aerated bilaterally. Calcified granuloma is noted in the left base. Old rib fractures are seen. No acute infiltrate or sizable effusion is noted. IMPRESSION: No active cardiopulmonary disease. Electronically Signed   By: Inez Catalina M.D.   On: 08/09/2017 13:52   Ct Abdomen Pelvis W Contrast  Result Date: 08/09/2017 CLINICAL DATA:  77 year old female with a history abdominal pain and nausea/vomiting EXAM: CT ABDOMEN AND PELVIS WITH CONTRAST TECHNIQUE: Multidetector CT imaging of the abdomen and pelvis was performed using the standard protocol following bolus administration of intravenous contrast. CONTRAST:  130m ISOVUE-300 IOPAMIDOL (ISOVUE-300) INJECTION 61% COMPARISON:  06/07/2016 FINDINGS: Lower chest: No acute abnormality. Hepatobiliary: Unremarkable appearance of liver parenchyma. Cholecystectomy. Mild left-sided and right-sided intrahepatic ductal dilatation. The greatest diameter of the common bile duct measures 10 mm-11 mm which is slightly increased from the comparison CT. No radiopaque stones. Pancreas: Unremarkable appearance of the pancreas with mild prominence of the pancreatic duct. Spleen: Unremarkable spleen Adrenals/Urinary Tract: Unremarkable adrenal glands. Kidneys without hydronephrosis. No perinephric stranding. Symmetric perfusion.  Unremarkable course the bilateral ureters. Urinary bladder is partially distended. There is urinary bladder wall thickening at the dome, with extension of a low-density tract along the expected course of the urachal remnant. There is mild inflammation at this site. Stomach/Bowel: Unremarkable appearance of the stomach. Unremarkable small bowel. No transition point. No focal wall thickening of small bowel. No transition point identified. No significant inflammatory changes of the mesenteric. Normal appendix. Mild stool burden of colon. No focal inflammatory changes. No transition point diverticular disease without associated inflammation. Vascular/Lymphatic: Vascular calcifications. Mesenteric arteries, bilateral renal arteries, bilateral iliac arteries and common femoral arteries patent. No retroperitoneal, abdominal, mesenteric adenopathy. Reproductive: Hysterectomy Other: No ventral wall hernia. Musculoskeletal: No acute displaced fracture. Multilevel degenerative changes of the spine. Degenerative changes are most pronounced at L2-L3, L3-L4, L5-S1 where there are significant endplate changes and vacuum disc phenomenon. Similar appearance of grade 1/grade 2 anterolisthesis of L4 on L5. Degenerative changes of the hips. L2 intravertebral hemangioma IMPRESSION: Inflammatory changes of the dome of the urinary bladder with what appears to be a urachal remnant and soft tissue thickening. Differential diagnosis includes both inflammatory/infectious cystitis, as well as carcinoma, which can be harbored within urachal remnant. Correlation with urinalysis may be useful as well as urine cytology and referral for urology evaluation. Mild intrahepatic and extrahepatic biliary ductal dilatation in this patient status post cholecystectomy. No radiopaque stones are identified, and the changes may be chronic. Correlation with lab values may be useful. Diverticular disease without evidence of acute diverticulitis. Aortic  Atherosclerosis (ICD10-I70.0). Electronically Signed   By: JCorrie MckusickD.O.   On: 08/09/2017 14:50    Pending Labs Unresulted Labs (From admission, onward)   Start     Ordered   08/09/17 1404  ABO/Rh  Once,  R     08/09/17 1404      Vitals/Pain Today's Vitals   08/09/17 1413 08/09/17 1449 08/09/17 1538 08/09/17 1600  BP:   (!) 176/91 (!) 150/76  Pulse:   60 (!) 59  Resp:   18 (!) 21  Temp: 98.7 F (37.1 C)  98 F (36.7 C)   TempSrc: Rectal  Oral   SpO2:   95% 94%  PainSc:  7       Isolation Precautions No active isolations  Medications Medications  pantoprazole (PROTONIX) EC tablet 40 mg (has no administration in time range)  FLUoxetine (PROZAC) tablet 20 mg (has no administration in time range)  oxybutynin (DITROPAN) tablet 5 mg (has no administration in time range)  tamsulosin (FLOMAX) capsule 0.4 mg (has no administration in time range)  loratadine (CLARITIN) tablet 10 mg (has no administration in time range)  0.9 %  sodium chloride infusion ( Intravenous New Bag/Given 08/09/17 1359)  morphine 4 MG/ML injection 4 mg (4 mg Intravenous Given 08/09/17 1359)  ondansetron (ZOFRAN) injection 4 mg (4 mg Intravenous Given 08/09/17 1406)  famotidine (PEPCID) IVPB 20 mg premix (0 mg Intravenous Stopped 08/09/17 1551)  iopamidol (ISOVUE-300) 61 % injection (100 mLs  Contrast Given 08/09/17 1426)    Mobility walks

## 2017-08-09 NOTE — ED Provider Notes (Signed)
[facility] DEPT Provider Note   CSN: [id number] Arrival date & time: 08/09/17  1125     History   Chief Complaint Chief Complaint  Patient presents with  . Abdominal Pain  . Emesis    HPI Sherri Dominguez is a 77 y.o. female with history of hypertension, IBS with constipation, hyperlipidemia, dementia is here for abdominal pain.  Abdominal pain is described as dull, intermittent and nonradiating.  Located to the epigastric/left upper quadrant.  Onset several months ago, usually flares up twice a week.  Has had episodes in the morning on empty stomach and at night but also after eating.  No aggravating factors that she is aware of.  No interventions for this.  Has noticed some intermittent stinging at the end of urination for the last 2 weeks, noticed blood in her urine this morning.  Other associated symptoms include nausea and vomiting intermittent for the last several months however this morning she noticed dark red blood in her emesis, no black or coffee-ground emesis.  Long history of IBS with chronic constipation that is unchanged, does take stool softener.  Last BM yesterday formed, painless without blood.  Once in a while she feels lightheaded when getting up too fast, this lasts only a short period of time, ongoing for several months as well.  No abdominal surgeries.  No history of pancreatitis, gastritis, ulcers, gallstones, EtOH use, cigarette use, NSAID use.  No abdominal surgeries.  She denies fevers, cough, chest pain or shortness of breath with exertion, pleuritic chest pain, syncope.  No h/o DM, ACS/MI, kidney stones.  No early ACS in family.  HPI  Past Medical History:  Diagnosis Date  . Depressive disorder, not elsewhere classified   . Essential hypertension, benign   . Family history of malignant neoplasm of gastrointestinal tract   . Generalized osteoarthrosis, unspecified site   . Irritable bowel syndrome   . Other specified disorder of  intestines   . 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 Pure hypercholesterolemia   . Sleep apnea   . Stroke ([place])   . Unspecified constipation     Patient Active Problem List   Diagnosis Date Noted  . Anemia 05/25/2010  . Constipation - functional 05/25/2010  . Personal history of colonic polyps 05/25/2010  . COLONIC POLYPS, ADENOMATOUS 01/02/2007  . HYPERCHOLESTEROLEMIA 01/02/2007  . DEPRESSION, CHRONIC 01/02/2007  . ESSENTIAL HYPERTENSION, BENIGN 01/02/2007  . CONSTIPATION 01/02/2007  . IBS 01/02/2007  . MELANOSIS COLI 01/02/2007  . DEGENERATIVE JOINT DISEASE, GENERALIZED 01/02/2007    Past Surgical History:  Procedure Laterality Date  . CHOLECYSTECTOMY    . COLON RESECTION    . VAGINAL HYSTERECTOMY       OB History   None      Home Medications    Prior to Admission medications   Medication Sig Start Date End Date Taking? Authorizing Provider  calcium carbonate 200 MG capsule Take 250 mg by mouth daily.      [provider]  Cholecalciferol (D-3-5) 5000 UNITS capsule Take 5,000 Units by mouth daily.      [provider]  fexofenadine (ALLEGRA) 180 MG tablet Take 180 mg by mouth daily.      [provider]  FLUoxetine (PROZAC) 20 MG tablet Take 20 mg by mouth daily.      [provider]  magnesium 30 MG tablet Take 30 mg by mouth daily.      [provider]  Multiple  Vitamins-Minerals (ICAPS PO) Take 1 capsule by mouth daily.      [provider]  oxybutynin (DITROPAN) 5 MG tablet Take 5 mg by mouth daily.      [provider]  Tamsulosin HCl (FLOMAX) 0.4 MG CAPS Take 0.4 mg by mouth daily.      [provider]  vitamin B-12 (CYANOCOBALAMIN) 1000 MCG tablet Take 1,000 mcg by mouth daily.      [provider]    Family History Family History  Problem Relation Age of Onset  . Colon cancer Mother        mets  . Colon cancer Unknown        uncle  . Irritable  bowel syndrome Mother     Social History Social History   Tobacco Use  . Smoking status: Never Smoker  Substance Use Topics  . Alcohol use: No  . Drug use: No     Allergies   Codeine   Review of Systems Review of Systems  Gastrointestinal: Positive for abdominal pain, constipation (chronic unchanged), nausea and vomiting.       Dark blood in emesis   Neurological: Positive for light-headedness.  Psychiatric/Behavioral: Positive for confusion (h/o dementia).  All other systems reviewed and are negative.    Physical Exam Updated Vital Signs BP (!) 150/76   Pulse (!) 59   Temp 98 F (36.7 C) (Oral)   Resp (!) 21   SpO2 94%   Physical Exam  Constitutional: She is oriented to person, place, and time. She appears well-developed and well-nourished. No distress.  Non toxic. Pleasant.    HENT:  Head: Normocephalic and atraumatic.  Nose: Nose normal.  Moist mucous membranes. No blood in oropharynx.   Eyes: Pupils are equal, round, and reactive to light. Conjunctivae and EOM are normal.  Neck: Normal range of motion.  Cardiovascular: Normal rate, regular rhythm and intact distal pulses.  2+ DP and radial pulses bilaterally. No LE edema or calf tenderness.   Pulmonary/Chest: Effort normal and breath sounds normal.  No pain with inspiration. No anterior/posterior thorax tenderness.   Abdominal: Soft. Bowel sounds are normal. There is tenderness in the epigastric area and left upper quadrant. There is guarding.  Tenderness with guarding to the epigastrium and left upper quadrant immediately below the left costal margin.  Mild tenderness to left CVA.  Suprapubic tenderness.  Soft.  No obvious distention.  Active bowel sounds to lower quadrants.  Musculoskeletal: Normal range of motion.  Neurological: She is alert and oriented to person, place, and time.  Skin: Skin is warm and dry. Capillary refill takes less than 2 seconds.  Psychiatric: She has a normal mood and affect. Her  behavior is normal.  Nursing note and vitals reviewed.    ED Treatments / Results  Labs (all labs ordered are listed, but only abnormal results are displayed) Labs Reviewed  COMPREHENSIVE METABOLIC PANEL - Abnormal; Notable for the following components:      Result Value   Sodium 132 (*)    Potassium 5.2 (*)    CO2 20 (*)    All other components within normal limits  URINALYSIS, ROUTINE W REFLEX MICROSCOPIC - Abnormal; Notable for the following components:   Color, Urine RED (*)    APPearance CLOUDY (*)    Glucose, UA   (*)    Value: TEST NOT REPORTED DUE TO COLOR INTERFERENCE OF URINE PIGMENT   Hgb urine dipstick   (*)    Value: TEST NOT REPORTED DUE  TO COLOR INTERFERENCE OF URINE PIGMENT   Bilirubin Urine   (*)    Value: TEST NOT REPORTED DUE TO COLOR INTERFERENCE OF URINE PIGMENT   Ketones, ur   (*)    Value: TEST NOT REPORTED DUE TO COLOR INTERFERENCE OF URINE PIGMENT   Protein, ur   (*)    Value: TEST NOT REPORTED DUE TO COLOR INTERFERENCE OF URINE PIGMENT   Nitrite   (*)    Value: TEST NOT REPORTED DUE TO COLOR INTERFERENCE OF URINE PIGMENT   Leukocytes, UA   (*)    Value: TEST NOT REPORTED DUE TO COLOR INTERFERENCE OF URINE PIGMENT   All other components within normal limits  URINALYSIS, MICROSCOPIC (REFLEX) - Abnormal; Notable for the following components:   Bacteria, UA RARE (*)    All other components within normal limits  LIPASE, BLOOD  CBC  POC OCCULT BLOOD, ED  I-STAT CG4 LACTIC ACID, ED  I-STAT TROPONIN, ED  TYPE AND SCREEN  ABO/RH    EKG None  Radiology Dg Chest 2 View  Result Date: 08/09/2017 CLINICAL DATA:  Abdominal and chest pain EXAM: CHEST - 2 VIEW COMPARISON:  10/21/2016 FINDINGS: Cardiac shadows within normal limits. The lungs are well aerated bilaterally. Calcified granuloma is noted in the left base. Old rib fractures are seen. No acute infiltrate or sizable effusion is noted. IMPRESSION: No active cardiopulmonary disease. Electronically  Signed   By: Inez Catalina M.D.   On: 08/09/2017 13:52   Ct Abdomen Pelvis W Contrast  Result Date: 08/09/2017 CLINICAL DATA:  77 year old female with a history abdominal pain and nausea/vomiting EXAM: CT ABDOMEN AND PELVIS WITH CONTRAST TECHNIQUE: Multidetector CT imaging of the abdomen and pelvis was performed using the standard protocol following bolus administration of intravenous contrast. CONTRAST:  155mL ISOVUE-300 IOPAMIDOL (ISOVUE-300) INJECTION 61% COMPARISON:  06/07/2016 FINDINGS: Lower chest: No acute abnormality. Hepatobiliary: Unremarkable appearance of liver parenchyma. Cholecystectomy. Mild left-sided and right-sided intrahepatic ductal dilatation. The greatest diameter of the common bile duct measures 10 mm-11 mm which is slightly increased from the comparison CT. No radiopaque stones. Pancreas: Unremarkable appearance of the pancreas with mild prominence of the pancreatic duct. Spleen: Unremarkable spleen Adrenals/Urinary Tract: Unremarkable adrenal glands. Kidneys without hydronephrosis. No perinephric stranding. Symmetric perfusion. Unremarkable course the bilateral ureters. Urinary bladder is partially distended. There is urinary bladder wall thickening at the dome, with extension of a low-density tract along the expected course of the urachal remnant. There is mild inflammation at this site. Stomach/Bowel: Unremarkable appearance of the stomach. Unremarkable small bowel. No transition point. No focal wall thickening of small bowel. No transition point identified. No significant inflammatory changes of the mesenteric. Normal appendix. Mild stool burden of colon. No focal inflammatory changes. No transition point diverticular disease without associated inflammation. Vascular/Lymphatic: Vascular calcifications. Mesenteric arteries, bilateral renal arteries, bilateral iliac arteries and common femoral arteries patent. No retroperitoneal, abdominal, mesenteric adenopathy. Reproductive:  Hysterectomy Other: No ventral wall hernia. Musculoskeletal: No acute displaced fracture. Multilevel degenerative changes of the spine. Degenerative changes are most pronounced at L2-L3, L3-L4, L5-S1 where there are significant endplate changes and vacuum disc phenomenon. Similar appearance of grade 1/grade 2 anterolisthesis of L4 on L5. Degenerative changes of the hips. L2 intravertebral hemangioma IMPRESSION: Inflammatory changes of the dome of the urinary bladder with what appears to be a urachal remnant and soft tissue thickening. Differential diagnosis includes both inflammatory/infectious cystitis, as well as carcinoma, which can be harbored within urachal remnant. Correlation with urinalysis may be useful as  well as urine cytology and referral for urology evaluation. Mild intrahepatic and extrahepatic biliary ductal dilatation in this patient status post cholecystectomy. No radiopaque stones are identified, and the changes may be chronic. Correlation with lab values may be useful. Diverticular disease without evidence of acute diverticulitis. Aortic Atherosclerosis (ICD10-I70.0). Electronically Signed   By: Corrie Mckusick D.O.   On: 08/09/2017 14:50    Procedures Procedures (including critical care time)  Medications Ordered in ED Medications  0.9 %  sodium chloride infusion ( Intravenous New Bag/Given 08/09/17 1359)  morphine 4 MG/ML injection 4 mg (4 mg Intravenous Given 08/09/17 1359)  ondansetron (ZOFRAN) injection 4 mg (4 mg Intravenous Given 08/09/17 1406)  famotidine (PEPCID) IVPB 20 mg premix (0 mg Intravenous Stopped 08/09/17 1551)  iopamidol (ISOVUE-300) 61 % injection (100 mLs  Contrast Given 08/09/17 1426)     Initial Impression / Assessment and Plan / ED Course  I have reviewed the triage vital signs and the nursing notes.  Pertinent labs & imaging results that were available during my care of the patient were reviewed by me and considered in my medical decision making (see chart for  details).  Clinical Course as of Aug 09 1625  Sat Aug 09, 2017  1505 I personally evaluated this patient after APP.  Mild to moderate tenderness to the left upper quadrant.  Report of hematemesis this morning.  Large-bore access obtained, CT pending to rule out gastric ulcer with perforation.  Will consider admission for EGD.   [MB]  1520 IMPRESSION: Inflammatory changes of the dome of the urinary bladder with what appears to be a urachal remnant and soft tissue thickening. Differential diagnosis includes both inflammatory/infectious cystitis, as well as carcinoma, which can be harbored within urachal remnant. Correlation with urinalysis may be useful as well as urine cytology and referral for urology evaluation.  Mild intrahepatic and extrahepatic biliary ductal dilatation in this patient status post cholecystectomy. No radiopaque stones are identified, and the changes may be chronic. Correlation with lab values may be useful.  Diverticular disease without evidence of acute diverticulitis.  Aortic Atherosclerosis (ICD10-I70.0).    CT ABDOMEN PELVIS W CONTRAST [CG]  1534 Color, Urine(!): RED [CG]  1618 Spoke to Dr Loletha Carrow with [facility] regarding patient   [CG]    Clinical Course User Index [CG] Kinnie Feil, PA-C [MB] Maudie Flakes, MD    77 year old female here with 2 episodes of reported hematemesis, described as dark maroon.  No coffee-ground emesis.  Has had epigastric/left upper quadrant pain and nausea and vomiting 2-3 times a week for the last several months.  No history of peptic ulcer disease, varices, liver disease, anticoagulation, EtOH abuse, NSAID use.  Moderate epigastric/left upper quadrant tenderness with guarding on exam, no peritonitis.  HD stable.  No gross blood on rectal exam.  Considering PUD versus gastritis versus Mallory-Weiss tear.  She is status post cholecystectomy.  Given age also considering malignancy.  Low suspicion for  Boerhaave's.  Work-up remarkable for inflammatory changes to urinary bladder that may be infectious/inflammatory also potentially malignancy.  She has dysuria and suprapubic tenderness, favoring infection.  Reevaluated patient and she feels better, she is hungry.  Given age, risk will consult [facility] for thoughts on EGD.   Final Clinical Impressions(s) / ED Diagnoses   Spoke to GI regarding patient.  Given age, no previous EGD in the past, baseline dementia, feel patient is high risk for complication.  Will request admission for undifferentiated upper GI bleeding.  Discussed  plan with patient and family at bedside who are in agreement.  Pt shared with Dr Sedonia Small. Final diagnoses:  Upper GI bleed    ED Discharge Orders    None       Arlean Hopping 08/09/17 1628    Maudie Flakes, MD 08/09/17 [phone]

## 2017-08-09 NOTE — ED Triage Notes (Addendum)
Per EMS from urgent care. Pt reports severe abd pain x2 months. Vomiting red blood x2 days and being incontinent of stool when vomiting. Tender to [facility]. Hx of dementia. Lives alone and drives. Repetitive in questioning. A&O x4. Ambulatory with EMS.  Pt reports vomiting about 2x a week with streaks of red blood.  BP 188/109 HR 112 Temp 98.1 95%

## 2017-08-09 NOTE — H&P (Addendum)
History and Physical    Sherri Dominguez  QPY:[id number]  DOB: September 08, 1940  DOA: 08/09/2017 PCP: Lorelei Pont, MD   Patient coming from: home  Chief Complaint:  Vomiting blood  HPI: Sherri Dominguez is a 77 y.o. female with medical history of HTN, CVA, depression who presents from home because of vomiting blood. She has been vomiting at least once a week for many months now. She has noticed blood in her vomitus on a number of occasions which she describes a mall amount of bright red blood. Most recently, she had an episode this AM. She does not routinely have nausea, heartburn, reflux, loss of appetite or hiccoughs and does not take NSAIDs. She has some pain in her LUQ as well on and off as well which is moderate in nature and at times has epigastric pain. No radiation to the flank. She has lost about 5 lbs in the past few months.  Of note, her urine is bloody. When initially questioned if she has urinary issues, she states she does not. When reminded that she has hematuria today, she admits to having on and off bloody urine. This current episode started today. No suprapubic pain or dysuria but has felt an urgency to urinate yesterday and today.   ED Course: sodium 132, K 5.2 CT abd pelvis: s/p cholecystectomy with mild bile duct dilatation Inflammatory changes of the dome of the urinary bladder with what appears to be a urachal remnant and soft tissue thickening. Differential diagnosis includes both inflammatory/infectious cystitis, as well as carcinoma, which can be harbored within urachal remnant.  Review of Systems:  All other systems reviewed and apart from HPI, are negative.  Past Medical History:  Diagnosis Date  . Depressive disorder, not elsewhere classified   . Essential hypertension, benign   . Family history of malignant neoplasm of gastrointestinal tract   . Generalized osteoarthrosis, unspecified site   . Irritable bowel syndrome   . Other specified disorder of  intestines   . 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 Pure hypercholesterolemia   . Sleep apnea   . Stroke ([place])   . Unspecified constipation     Past Surgical History:  Procedure Laterality Date  . CHOLECYSTECTOMY    . COLON RESECTION    . VAGINAL HYSTERECTOMY      Social History:   reports that she has never smoked. She does not have any smokeless tobacco history on file. She reports that she does not drink alcohol or use drugs.  Allergies  Allergen Reactions  . Codeine     Family History  Problem Relation Age of Onset  . Colon cancer Mother        mets  . Colon cancer Unknown        uncle  . Irritable bowel syndrome Mother      Prior to Admission medications   Medication Sig Start Date End Date Taking? Authorizing Provider  calcium carbonate 200 MG capsule Take 250 mg by mouth daily.      [provider]  Cholecalciferol (D-3-5) 5000 UNITS capsule Take 5,000 Units by mouth daily.      [provider]  fexofenadine (ALLEGRA) 180 MG tablet Take 180 mg by mouth daily.      [provider]  FLUoxetine (PROZAC) 20 MG tablet Take 20 mg by mouth daily.      [provider]  magnesium 30 MG tablet Take 30 mg by mouth daily.  [provider]  Multiple Vitamins-Minerals (ICAPS PO) Take 1 capsule by mouth daily.      [provider]  oxybutynin (DITROPAN) 5 MG tablet Take 5 mg by mouth daily.      [provider]  Tamsulosin HCl (FLOMAX) 0.4 MG CAPS Take 0.4 mg by mouth daily.      [provider]  vitamin B-12 (CYANOCOBALAMIN) 1000 MCG tablet Take 1,000 mcg by mouth daily.      [provider]    Physical Exam: Wt Readings from Last 3 Encounters:  05/25/10 61.2 kg (135 lb)   Vitals:   08/09/17 1400 08/09/17 1413 08/09/17 1538 08/09/17 1600  BP: (!) 156/95  (!) 176/91 (!) 150/76  Pulse: 65  60 (!) 59  Resp: 20  18 (!) 21  Temp:  98.7 F (37.1 C) 98 F (36.7 C)     TempSrc:  Rectal Oral   SpO2: 100%  95% 94%      Constitutional:  Calm & comfortable Eyes: PERRLA, lids and conjunctivae normal ENMT:  Mucous membranes are moist.  Pharynx clear of exudate   Normal dentition.  Neck: Supple, no masses  Respiratory:  Clear to auscultation bilaterally  Normal respiratory effort.  Cardiovascular:  S1 & S2 heard, regular rate and rhythm No Murmurs Abdomen:  Non distended No tenderness, No masses Bowel sounds normal Extremities:  No clubbing / cyanosis No pedal edema No joint deformity    Skin:  No rashes, lesions or ulcers Neurologic:  AAO x 3 CN 2-12 grossly intact Sensation intact Strength 5/5 in all 4 extremities Psychiatric:  Normal Mood and affect    Labs on Admission: I have personally reviewed following labs and imaging studies  CBC: Recent Labs  Lab 08/09/17 1235  WBC 10.1  HGB 12.1  HCT 36.2  MCV 90.3  PLT 509   Basic Metabolic Panel: Recent Labs  Lab 08/09/17 1235  NA 132*  K 5.2*  CL 100  CO2 20*  GLUCOSE 96  BUN 13  CREATININE 0.70  CALCIUM 9.3   GFR: CrCl cannot be calculated (Unknown ideal weight.). Liver Function Tests: Recent Labs  Lab 08/09/17 1235  AST 26  ALT 12  ALKPHOS 78  BILITOT 0.7  PROT 7.9  ALBUMIN 3.9   Recent Labs  Lab 08/09/17 1235  LIPASE 34   No results for input(s): AMMONIA in the last 168 hours. Coagulation Profile: No results for input(s): INR, PROTIME in the last 168 hours. Cardiac Enzymes: No results for input(s): CKTOTAL, CKMB, CKMBINDEX, TROPONINI in the last 168 hours. BNP (last 3 results) No results for input(s): PROBNP in the last 8760 hours. HbA1C: No results for input(s): HGBA1C in the last 72 hours. CBG: No results for input(s): GLUCAP in the last 168 hours. Lipid Profile: No results for input(s): CHOL, HDL, LDLCALC, TRIG, CHOLHDL, LDLDIRECT in the last 72 hours. Thyroid Function Tests: No results for input(s): TSH, T4TOTAL, FREET4, T3FREE,  THYROIDAB in the last 72 hours. Anemia Panel: No results for input(s): VITAMINB12, FOLATE, FERRITIN, TIBC, IRON, RETICCTPCT in the last 72 hours. Urine analysis:    Component Value Date/Time   COLORURINE RED (A) 08/09/2017 1305   APPEARANCEUR CLOUDY (A) 08/09/2017 1305   LABSPEC  08/09/2017 1305    TEST NOT REPORTED DUE TO COLOR INTERFERENCE OF URINE PIGMENT   PHURINE  08/09/2017 1305    TEST NOT REPORTED DUE TO COLOR INTERFERENCE OF URINE PIGMENT   GLUCOSEU (A) 08/09/2017 1305    TEST NOT REPORTED  DUE TO COLOR INTERFERENCE OF URINE PIGMENT   HGBUR (A) 08/09/2017 1305    TEST NOT REPORTED DUE TO COLOR INTERFERENCE OF URINE PIGMENT   BILIRUBINUR (A) 08/09/2017 1305    TEST NOT REPORTED DUE TO COLOR INTERFERENCE OF URINE PIGMENT   KETONESUR (A) 08/09/2017 1305    TEST NOT REPORTED DUE TO COLOR INTERFERENCE OF URINE PIGMENT   PROTEINUR (A) 08/09/2017 1305    TEST NOT REPORTED DUE TO COLOR INTERFERENCE OF URINE PIGMENT   NITRITE (A) 08/09/2017 1305    TEST NOT REPORTED DUE TO COLOR INTERFERENCE OF URINE PIGMENT   LEUKOCYTESUR (A) 08/09/2017 1305    TEST NOT REPORTED DUE TO COLOR INTERFERENCE OF URINE PIGMENT   Sepsis Labs: @LABRCNTIP (procalcitonin:4,lacticidven:4) )No results found for this or any previous visit (from the past 240 hour(s)).   Radiological Exams on Admission: Dg Chest 2 View  Result Date: 08/09/2017 CLINICAL DATA:  Abdominal and chest pain EXAM: CHEST - 2 VIEW COMPARISON:  10/21/2016 FINDINGS: Cardiac shadows within normal limits. The lungs are well aerated bilaterally. Calcified granuloma is noted in the left base. Old rib fractures are seen. No acute infiltrate or sizable effusion is noted. IMPRESSION: No active cardiopulmonary disease. Electronically Signed   By: Inez Catalina M.D.   On: 08/09/2017 13:52   Ct Abdomen Pelvis W Contrast  Result Date: 08/09/2017 CLINICAL DATA:  77 year old female with a history abdominal pain and nausea/vomiting EXAM: CT ABDOMEN AND  PELVIS WITH CONTRAST TECHNIQUE: Multidetector CT imaging of the abdomen and pelvis was performed using the standard protocol following bolus administration of intravenous contrast. CONTRAST:  142mL ISOVUE-300 IOPAMIDOL (ISOVUE-300) INJECTION 61% COMPARISON:  06/07/2016 FINDINGS: Lower chest: No acute abnormality. Hepatobiliary: Unremarkable appearance of liver parenchyma. Cholecystectomy. Mild left-sided and right-sided intrahepatic ductal dilatation. The greatest diameter of the common bile duct measures 10 mm-11 mm which is slightly increased from the comparison CT. No radiopaque stones. Pancreas: Unremarkable appearance of the pancreas with mild prominence of the pancreatic duct. Spleen: Unremarkable spleen Adrenals/Urinary Tract: Unremarkable adrenal glands. Kidneys without hydronephrosis. No perinephric stranding. Symmetric perfusion. Unremarkable course the bilateral ureters. Urinary bladder is partially distended. There is urinary bladder wall thickening at the dome, with extension of a low-density tract along the expected course of the urachal remnant. There is mild inflammation at this site. Stomach/Bowel: Unremarkable appearance of the stomach. Unremarkable small bowel. No transition point. No focal wall thickening of small bowel. No transition point identified. No significant inflammatory changes of the mesenteric. Normal appendix. Mild stool burden of colon. No focal inflammatory changes. No transition point diverticular disease without associated inflammation. Vascular/Lymphatic: Vascular calcifications. Mesenteric arteries, bilateral renal arteries, bilateral iliac arteries and common femoral arteries patent. No retroperitoneal, abdominal, mesenteric adenopathy. Reproductive: Hysterectomy Other: No ventral wall hernia. Musculoskeletal: No acute displaced fracture. Multilevel degenerative changes of the spine. Degenerative changes are most pronounced at L2-L3, L3-L4, L5-S1 where there are significant  endplate changes and vacuum disc phenomenon. Similar appearance of grade 1/grade 2 anterolisthesis of L4 on L5. Degenerative changes of the hips. L2 intravertebral hemangioma IMPRESSION: Inflammatory changes of the dome of the urinary bladder with what appears to be a urachal remnant and soft tissue thickening. Differential diagnosis includes both inflammatory/infectious cystitis, as well as carcinoma, which can be harbored within urachal remnant. Correlation with urinalysis may be useful as well as urine cytology and referral for urology evaluation. Mild intrahepatic and extrahepatic biliary ductal dilatation in this patient status post cholecystectomy. No radiopaque stones are identified, and the changes may  be chronic. Correlation with lab values may be useful. Diverticular disease without evidence of acute diverticulitis. Aortic Atherosclerosis (ICD10-I70.0). Electronically Signed   By: Corrie Mckusick D.O.   On: 08/09/2017 14:50    EKG: Independently reviewed. NSR at 64 bpm  Assessment/Plan Principal Problem:   Hematemesis- weekly vomiting  - FOB neg - start Protonix orally - [facility] has been consulted and plan for EGD tomorrow - allow clear liquid diet - not anemic and not orthostatic  Active Problems: Mild hyperkalemia -I am not sure of the cause-  Not hemolyzed- not on medications that can cause hyperkalemia - repeat Bmet now- she has received some IVF in the ED and K may have improved  LUQ pain - not sure what to make of this- ? Muscular-   Hematuria with changes on CT showing inflammation of the dome of the bladder - I have told her that she will need a urology work up and we will refer her to a Urologist on d/c - I notice that she has Flomax and Oxybutynin listed on her home med list but she states she has not been taking these - she states she has no h/o nephrolithiasis  - f/u urine culture to r/o UTI- will not start antibiotics yet    Depression - cont Prozac  HTN - cotn  Norvasc and Toprol with holding parameters  Dementia- mild? - cont Aricept  HLD - cont Lipitor    DVT prophylaxis: SCDs  Code Status: Full code  Family Communication:  Advised to call her son and daughter in law Disposition Plan: med/surg  Consults called: GI  - Dr Loletha Carrow  Admission status: inpatient    Debbe Odea MD [facility] Pager: [email or web address] Password [email or web address] 7PM-7AM, please contact night-coverage   08/09/2017, 4:29 PM

## 2017-08-09 NOTE — ED Notes (Signed)
Hospitalist and GI at bedside ° °

## 2017-08-09 NOTE — Consult Note (Signed)
[facility] Consult Note   History Sherri Dominguez MRN # [id number]  Date of Admission: 08/09/2017 Date of Consultation: 08/09/2017 Referring physician: Dr. Debbe Odea, MD Primary Care Provider: Lorelei Pont, MD Primary Gastroenterologist: [facility], previously Dr. 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  She is a somewhat inconsistent historian, but reports at least several months of frequent left upper quadrant pain that is nonradiating with no clear triggers.  She also has an episode of vomiting about twice a week.  Yesterday today those episodes of vomiting reportedly had red blood.  She has seen no blood per rectum or black tarry stool.  She denies dysphagia dysphagia, and think she may have lost 4 pounds in the last month or 2.  She points to the left upper quadrant under the rib cage as the exact spot of the pain.  Earlier today she also had hematuria that she reported to the ED physician, but when brought her attention, she did not recall that.  Her last known colonoscopy with [facility] was in 2007, no upper endoscopy on file.  She carries a diagnosis of constipation type IBS.  ROS:  Constitutional: No fever or chills  Musculoskeletal:   Intermittent low back pain Urinary: Hematuria as noted above Psychiatric:   Dysphoric mood at times.  She reports losing her husband this year and also 1 of her sons this year.  She says another son died previously from cancer.  She lives alone with extended family members in the area.  All other systems are negative except as noted above in the HPI  Past Medical History Past Medical History:  Diagnosis Date  . Depressive disorder, not elsewhere classified   . Essential  hypertension, benign   . Family history of malignant neoplasm of gastrointestinal tract   . Generalized osteoarthrosis, unspecified site   . Irritable bowel syndrome   . Other specified disorder of intestines   . 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 Pure hypercholesterolemia   . Sleep apnea   . Stroke ([place])   . Unspecified constipation     Past Surgical History Past Surgical History:  Procedure Laterality Date  . CHOLECYSTECTOMY    . COLON RESECTION    . VAGINAL HYSTERECTOMY      Family History Family History  Problem Relation Age of Onset  . Colon cancer Mother        mets  . Irritable bowel syndrome Mother   . Colon cancer Unknown        uncle    Social History Social History   Socioeconomic History  . Marital status: Married    Spouse name: Not on file  . Number of children: 2  . Years of education: Not on file  . Highest education level: Not on file  Occupational History  . Occupation: Retired  Scientific laboratory technician  . Financial resource strain: Not on file  . Food insecurity:    Worry: Not on file    Inability: Not on file  . Transportation needs:    Medical: Not on file    Non-medical: Not on file  Tobacco Use  . Smoking status: Never Smoker  Substance and Sexual Activity  . Alcohol use: No  . Drug use: No  .  Sexual activity: Not on file  Lifestyle  . Physical activity:    Days per week: Not on file    Minutes per session: Not on file  . Stress: Not on file  Relationships  . Social connections:    Talks on phone: Not on file    Gets together: Not on file    Attends religious service: Not on file    Active member of club or organization: Not on file    Attends meetings of clubs or organizations: Not on file    Relationship status: Not on file  Other Topics Concern  . Not on file  Social History Narrative  . Not on file    Allergies Allergies  Allergen Reactions  . Codeine Other (See Comments)    Pt does not remember  what happens when taking this    Outpatient Meds Home medications from the H+P and/or nursing med reconciliation reviewed.  Inpatient med list reviewed  _____________________________________________________________________ Objective   Exam:  Current vital signs  Patient Vitals for the past 8 hrs:  BP Temp Temp src Pulse Resp SpO2  08/09/17 1600 (!) 150/76 - - (!) 59 (!) 21 94 %  08/09/17 1538 (!) 176/91 98 F (36.7 C) Oral 60 18 95 %  08/09/17 1413 - 98.7 F (37.1 C) Rectal - - -  08/09/17 1400 (!) 156/95 - - 65 20 100 %  08/09/17 1140 (!) 176/101 97.9 F (36.6 C) Oral 65 18 96 %   No intake or output data in the 24 hours ending 08/09/17 1729  Physical Exam:    General: this is a pleasant and well-appearing elderly woman  in no acute distress  Eyes: sclera anicteric, no redness  ENT: oral mucosa moist without lesions, no cervical or supraclavicular lymphadenopathy, good dentition  CV: RRR without murmur, S1/S2, no JVD,, no peripheral edema  Resp: clear to auscultation bilaterally, normal RR and effort noted  GI: soft, mild LUQ tenderness, with active bowel sounds.  The pain and tenderness are directly over a laparoscopy scar.  She has an additional laparoscopy scar just below the umbilicus.. No guarding or palpable organomegaly noted  Skin; warm and dry, no rash or jaundice noted  Neuro: awake, alert and oriented x 3. Normal gross motor function and fluent speech.  Labs:  Recent Labs  Lab 08/09/17 1235  WBC 10.1  HGB 12.1  HCT 36.2  PLT 280   Recent Labs  Lab 08/09/17 1235  NA 132*  K 5.2*  CL 100  CO2 20*  BUN 13  ALBUMIN 3.9  ALKPHOS 78  ALT 12  AST 26  GLUCOSE 96   No results for input(s): INR in the last 168 hours.  Radiologic studies:  CT abdomen and pelvis shows thickening possible inflammation of the bladder dome.  Nonspecific biliary ductal dilatation @ASSESSMENTPLANBEGIN @ Impression:  Left upper quadrant pain, episodic and  chronic for at least several months, history limited  Reported hematemesis, though it is not entirely clear if this history is accurate.  She looks well, stool was reportedly heme negative, normal hemoglobin and no witnessed hematemesis since admission.  No hypotension, certainly no evidence of ongoing overt or brisk GI bleeding.  Hematuria and abnormality of the bladder down on imaging.  [facility] is admitting and plans to consult urology.  Plan:  I offered her an upper endoscopy tomorrow for her upper abdominal pain and reported hematemesis.  I described the procedure in detail along with risks and benefits and the presence  of the [facility] hospital[facility].  Renly was agreeable.  Despite some memory difficulty, I feel she is will to give informed consent for procedure.  The benefits and risks of the planned procedure were described in detail with the patient or (when appropriate) their health care proxy.  Risks were outlined as including, but not limited to, bleeding, infection, perforation, adverse medication reaction leading to cardiac or pulmonary decompensation, or pancreatitis (if ERCP).  The limitation of incomplete mucosal visualization was also discussed.  No guarantees or warranties were given.   Upper endoscopy is scheduled for tomorrow.  Further plans pending results.  Thank you for the courtesy of this consult.  Please contact me with any questions or concerns.  Nelida Meuse III Office: [phone]

## 2017-08-10 ENCOUNTER — Encounter (HOSPITAL_COMMUNITY): Payer: Self-pay | Admitting: *Deleted

## 2017-08-10 ENCOUNTER — Encounter (HOSPITAL_COMMUNITY)
Admission: EM | Disposition: A | Discharge status: Home / Self Care | Payer: Self-pay | Source: Home / Self Care | Attending: Emergency Medicine

## 2017-08-10 DIAGNOSIS — E875 Hyperkalemia: Secondary | ICD-10-CM | POA: Diagnosis present

## 2017-08-10 DIAGNOSIS — R10819 Abdominal tenderness, unspecified site: Secondary | ICD-10-CM | POA: Diagnosis present

## 2017-08-10 DIAGNOSIS — K920 Hematemesis: Secondary | ICD-10-CM | POA: Diagnosis not present

## 2017-08-10 DIAGNOSIS — R8290 Unspecified abnormal findings in urine: Secondary | ICD-10-CM | POA: Diagnosis present

## 2017-08-10 DIAGNOSIS — R1110 Vomiting, unspecified: Secondary | ICD-10-CM

## 2017-08-10 DIAGNOSIS — I10 Essential (primary) hypertension: Secondary | ICD-10-CM | POA: Diagnosis not present

## 2017-08-10 DIAGNOSIS — R1012 Left upper quadrant pain: Secondary | ICD-10-CM | POA: Diagnosis present

## 2017-08-10 HISTORY — PX: ESOPHAGOGASTRODUODENOSCOPY: SHX5428

## 2017-08-10 LAB — URINALYSIS, ROUTINE W REFLEX MICROSCOPIC
BILIRUBIN URINE: NEGATIVE
Glucose, UA: NEGATIVE mg/dL
KETONES UR: NEGATIVE mg/dL
Nitrite: POSITIVE — AB
Protein, ur: 30 mg/dL — AB
SPECIFIC GRAVITY, URINE: 1.014 (ref 1.005–1.030)
pH: 6 (ref 5.0–8.0)

## 2017-08-10 LAB — CBC
HCT: 35.3 % — ABNORMAL LOW (ref 36.0–46.0)
Hemoglobin: 11.4 g/dL — ABNORMAL LOW (ref 12.0–15.0)
MCH: 29.6 pg (ref 26.0–34.0)
MCHC: 32.3 g/dL (ref 30.0–36.0)
MCV: 91.7 fL (ref 78.0–100.0)
PLATELETS: 267 10*3/uL (ref 150–400)
RBC: 3.85 MIL/uL — ABNORMAL LOW (ref 3.87–5.11)
RDW: 15.3 % (ref 11.5–15.5)
WBC: 6.7 10*3/uL (ref 4.0–10.5)

## 2017-08-10 LAB — BASIC METABOLIC PANEL
Anion gap: 11 (ref 5–15)
BUN: 13 mg/dL (ref 8–23)
CHLORIDE: 101 mmol/L (ref 98–111)
CO2: 21 mmol/L — ABNORMAL LOW (ref 22–32)
CREATININE: 0.7 mg/dL (ref 0.44–1.00)
Calcium: 9.2 mg/dL (ref 8.9–10.3)
GFR calc non Af Amer: >60 mL/min (ref >60)
Glucose, Bld: 87 mg/dL (ref 70–99)
Potassium: 4.4 mmol/L (ref 3.5–5.1)
SODIUM: 133 mmol/L — AB (ref 135–145)

## 2017-08-10 LAB — ABO/RH: ABO/RH(D): A POS

## 2017-08-10 LAB — CK: Total CK: 63 U/L (ref 38–234)

## 2017-08-10 SURGERY — EGD (ESOPHAGOGASTRODUODENOSCOPY)
Anesthesia: Moderate Sedation

## 2017-08-10 MED ORDER — EPINEPHRINE PF 1 MG/10ML IJ SOSY
PREFILLED_SYRINGE | INTRAMUSCULAR | Status: AC
  Filled 2017-08-10: qty 10

## 2017-08-10 MED ORDER — MIDAZOLAM HCL 10 MG/2ML IJ SOLN
INTRAMUSCULAR | Status: DC | PRN
  Administered 2017-08-10 (×2): 1 mg via INTRAVENOUS

## 2017-08-10 MED ORDER — MIDAZOLAM HCL 5 MG/ML IJ SOLN
INTRAMUSCULAR | Status: AC
  Filled 2017-08-10: qty 2

## 2017-08-10 MED ORDER — FENTANYL CITRATE (PF) 100 MCG/2ML IJ SOLN
INTRAMUSCULAR | Status: AC
  Filled 2017-08-10: qty 2

## 2017-08-10 MED ORDER — SODIUM CHLORIDE 0.9 % IV SOLN
INTRAVENOUS | Status: DC
  Administered 2017-08-10: 04:00:00 via INTRAVENOUS

## 2017-08-10 MED ORDER — BUTAMBEN-TETRACAINE-BENZOCAINE 2-2-14 % EX AERO
INHALATION_SPRAY | CUTANEOUS | Status: DC | PRN
  Administered 2017-08-10: 1 via TOPICAL

## 2017-08-10 NOTE — Op Note (Signed)
[facility] Patient Name: Sherri Dominguez Procedure Date: 08/10/2017 MRN: [id number] Attending MD: Sherri Dominguez , MD Date of Birth: [birth year] CSN: [id number] Age: 77 Admit Type: Inpatient Procedure:                Upper GI endoscopy Indications:              Episodic abdominal pain in the left upper quadrant,                            self-reported hematemesis, episodic vomiting Providers:                Sherri Mussel L. Loletha Carrow, MD, Sherri Lawman, RN, Sherri Dominguez Tech., Technician Referring MD:              Medicines:                Midazolam 2 mg IV, Cetacaine spray Complications:            No immediate complications. Estimated Blood Loss:     Estimated blood loss: none. Procedure:                Pre-Anesthesia Assessment:                           - Prior to the procedure, a History and Physical                            was performed, and patient medications and                            allergies were reviewed. The patient's tolerance of                            previous anesthesia was also reviewed. The risks                            and benefits of the procedure and the sedation                            options and risks were discussed with the patient.                            All questions were answered, and informed consent                            was obtained. Prior Anticoagulants: The patient has                            taken no previous anticoagulant or antiplatelet                            agents. ASA Grade Assessment: II - A patient with  mild systemic disease. After reviewing the risks                            and benefits, the patient was deemed in                            satisfactory condition to undergo the procedure.                           After obtaining informed consent, the endoscope was                            passed under direct vision. Throughout the           procedure, the patient's blood pressure, pulse, and                            oxygen saturations were monitored continuously. The                            [id number]-[id number] ([id number] Olympus adult endoscope was                            introduced through the mouth, and advanced to the                            second part of duodenum. The upper GI endoscopy was                            accomplished without difficulty. The patient                            tolerated the procedure well. Scope In: Scope Out: Findings:      The esophagus was normal.      The stomach was normal.      The cardia and gastric fundus were normal on retroflexion.      The examined duodenum was normal. Impression:               - Normal esophagus.                           - Normal stomach.                           - Normal examined duodenum.                           - No specimens collected.                           No cause for symptoms was seen. CTAP scan                            unrevealing for symptoms as well. Hematuria and  bladder dome abnormality on CT scan to be                            investigated by urology. Moderate Sedation:      Moderate (conscious) sedation was administered by the endoscopy nurse       and supervised by the endoscopist. The following parameters were       monitored: oxygen saturation, heart rate, blood pressure, and response       to care. Total physician intraservice time was 6 minutes. Recommendation:           - Return patient to hospital ward for ongoing care.                           - Resume regular diet.                           - Follow up in GI clinic after discharge. Procedure Code(s):        --- Professional ---                           [phone], Esophagogastroduodenoscopy, flexible,                            transoral; diagnostic, including collection of                            specimen(s) by brushing or washing, when  performed                            (separate procedure) Diagnosis Code(s):        --- Professional ---                           R10.12, Left upper quadrant pain                           K92.0, Hematemesis                           R11.10, Vomiting, unspecified CPT copyright 2017 [employer]. All rights reserved. The codes documented in this report are preliminary and upon coder review may  be revised to meet current compliance requirements. Sherri Ariel L. Loletha Carrow, MD 08/10/2017 9:17:34 AM This report has been signed electronically. Number of Addenda: 0

## 2017-08-10 NOTE — Interval H&P Note (Signed)
History and Physical Interval Note:  08/10/2017 9:01 AM  Sherri Dominguez  has presented today for surgery, with the diagnosis of hematemesis  The various methods of treatment have been discussed with the patient and family. After consideration of risks, benefits and other options for treatment, the patient has consented to  Procedure(s): ESOPHAGOGASTRODUODENOSCOPY (EGD) (N/A) as a surgical intervention .  The patient's history has been reviewed, patient examined, no change in status, stable for surgery.  I have reviewed the patient's chart and labs.  Questions were answered to the patient's satisfaction.    Am Hgb 11.4  Nelida Meuse III

## 2017-08-10 NOTE — Progress Notes (Addendum)
PROGRESS NOTE  [name] AST:[id number] DOB: 04-03-1940 DOA: 08/09/2017 PCP: Lorelei Pont, MD  HPI  Sherri Dominguez is a 77 y.o. year old female with medical history significant for HTN, CVA, depression who presented on 08/09/2017 with reports of vomiting blood worsening left-sided abdominal pain.  Patient states over the last several weeks she has had intermittent episodes of dry heaves and vomiting with no clear association to eating.  She also has associated left-sided abdominal pain with these episodes.  She reports noticing blood in her vomitus on multiple occasions with last episode prior to this admission.  Because of worsening pain and persistent nausea vomiting patient presented to urgent care center who directed her to emergency department at [facility] long.  Denies any NSAID use, diarrhea, melena, chest pain, shortness of breath, loss of appetite.  Reported 5 pound weight loss in the past few months.  Of note, patient recently lost her husband.     Brief narrative She was hemodynamically stable in the ED, fecal occult negative, troponin negative lipase within normal limits, potassium of 5.2.  No LFT derangements, hemoglobin stable at 12.  CT abdomenNo evidence of acute diverticulitis.  Soft tissue thickening of urinary bladder dome, differential diagnosis including inflammatory/infectious cystitis as well as carcinoma.  And mild intrahepatic/extrahepatic biliary ductal dilatation, status post cholecystectomy no stones identified.  Chest x-ray showed no active cardiopulmonary disease she was given IV morphine, IV Zofran, IV Pepcid and IV fluids and admitted. On 7/28 patient underwent EGD by GI which showed no acute pathology.     Subjective No acute complaints overnight.  Denies any vomiting since being in the hospital.  No current abdominal pain.  Assessment/Plan: Principal Problem:   Hematemesis Active Problems:   Depression   Essential hypertension, benign    Constipation   Upper GI bleed   Reported hematemesis, stable -Status post EGD on 7/28 which was unremarkable -Hemoglobin remained stable -No hematemesis here -Continue oral Protonix - GI plans to follow-up as outpatient -Advance to regular diet  Left upper quadrant abdominal pain, resolved - CT abdomen showed no pathology that correlates -EGD unremarkable - Not reproducible on exam -Troponin negative, EKG shows no acute ischemic changes  Abnormal UA -CT scan with soft tissue thickening of urinary bladder, patient denies any dysuria or urinary frequency -UA: Red-colored urine, test interference due to color/pigment (unable to assess hemoglobin, nitrate, leukocytes) -Microscopic UA shows 0-5 red blood cells, 0-5 white blood cells, rare bacteria -She does have suprapubic tenderness -Obtain CK, repeat UA now (24 hours later) - Has prescription for Flomax and oxybutynin though patient states not taking  Hyperkalemia, resolved - Potassium on admission 5.2, now within normal limits  Depression, stable -Continue Prozac  Hypertension, stable -Continue amlodipine and Toprol  Dementia, stable -Alert and oriented to person place time and context -Continue Aricept, delirium precautions  Hyperlipidemia, stable -Continue Lipitor   Code Status: Full code  Family Communication: Will call family  Disposition Plan: Monitor hemoglobin, repeat UA, monitor on oral diet.   Consultants:  GI     Procedures:  7/28: EGD  Antimicrobials: Anti-infectives (From admission, onward)   None         Cultures:  None  Telemetry: No  DVT prophylaxis: SCDs   Objective: Vitals:   08/10/17 0910 08/10/17 0915 08/10/17 0920 08/10/17 0925  BP: (!) 184/95 (!) 168/92 (!) 165/88 (!) 143/74  Pulse: 63 (!) 59 82 66  Resp: 17 15 (!) 22 (!) 23  Temp:  97.7 F (  36.5 C)    TempSrc:  Oral    SpO2: 98% 97% 95% 95%  Weight:      Height:       No intake or output data in the 24  hours ending 08/10/17 1134 Filed Weights   08/10/17 0845  Weight: 61.2 kg (135 lb)    Exam:  Constitutional:normal appearing elderly female, comfortable in bed Eyes: EOMI, anicteric, normal conjunctivae ENMT: Oropharynx with moist mucous membranes, normal dentition Cardiovascular: RRR no MRGs, with no peripheral edema Respiratory: Normal respiratory effort, clear breath sounds  Abdomen: Soft,non-tender, nondistended, normal bowel sounds Skin: No rash ulcers, or lesions. Without skin tenting  Neurologic: Grossly no focal neuro deficit. Psychiatric:Appropriate affect, and mood. Mental status AAOx3  Data Reviewed: CBC: Recent Labs  Lab 08/09/17 1235 08/10/17 0459  WBC 10.1 6.7  HGB 12.1 11.4*  HCT 36.2 35.3*  MCV 90.3 91.7  PLT 280 267   Basic Metabolic Panel: Recent Labs  Lab 08/09/17 1235 08/09/17 1835 08/10/17 0459  NA 132* 134* 133*  K 5.2* 4.3 4.4  CL 100 104 101  CO2 20* 20* 21*  GLUCOSE 96 97 87  BUN 13 11 13   CREATININE 0.70 0.59 0.70  CALCIUM 9.3 9.2 9.2   GFR: Estimated Creatinine Clearance: 53 mL/min (by C-G formula based on SCr of 0.7 mg/dL). Liver Function Tests: Recent Labs  Lab 08/09/17 1235  AST 26  ALT 12  ALKPHOS 78  BILITOT 0.7  PROT 7.9  ALBUMIN 3.9   Recent Labs  Lab 08/09/17 1235  LIPASE 34   No results for input(s): AMMONIA in the last 168 hours. Coagulation Profile: No results for input(s): INR, PROTIME in the last 168 hours. Cardiac Enzymes: No results for input(s): CKTOTAL, CKMB, CKMBINDEX, TROPONINI in the last 168 hours. BNP (last 3 results) No results for input(s): PROBNP in the last 8760 hours. HbA1C: No results for input(s): HGBA1C in the last 72 hours. CBG: No results for input(s): GLUCAP in the last 168 hours. Lipid Profile: No results for input(s): CHOL, HDL, LDLCALC, TRIG, CHOLHDL, LDLDIRECT in the last 72 hours. Thyroid Function Tests: No results for input(s): TSH, T4TOTAL, FREET4, T3FREE, THYROIDAB in the  last 72 hours. Anemia Panel: No results for input(s): VITAMINB12, FOLATE, FERRITIN, TIBC, IRON, RETICCTPCT in the last 72 hours. Urine analysis:    Component Value Date/Time   COLORURINE RED (A) 08/09/2017 1305   APPEARANCEUR CLOUDY (A) 08/09/2017 1305   LABSPEC  08/09/2017 1305    TEST NOT REPORTED DUE TO COLOR INTERFERENCE OF URINE PIGMENT   PHURINE  08/09/2017 1305    TEST NOT REPORTED DUE TO COLOR INTERFERENCE OF URINE PIGMENT   GLUCOSEU (A) 08/09/2017 1305    TEST NOT REPORTED DUE TO COLOR INTERFERENCE OF URINE PIGMENT   HGBUR (A) 08/09/2017 1305    TEST NOT REPORTED DUE TO COLOR INTERFERENCE OF URINE PIGMENT   BILIRUBINUR (A) 08/09/2017 1305    TEST NOT REPORTED DUE TO COLOR INTERFERENCE OF URINE PIGMENT   KETONESUR (A) 08/09/2017 1305    TEST NOT REPORTED DUE TO COLOR INTERFERENCE OF URINE PIGMENT   PROTEINUR (A) 08/09/2017 1305    TEST NOT REPORTED DUE TO COLOR INTERFERENCE OF URINE PIGMENT   NITRITE (A) 08/09/2017 1305    TEST NOT REPORTED DUE TO COLOR INTERFERENCE OF URINE PIGMENT   LEUKOCYTESUR (A) 08/09/2017 1305    TEST NOT REPORTED DUE TO COLOR INTERFERENCE OF URINE PIGMENT   Sepsis Labs: @LABRCNTIP (procalcitonin:4,lacticidven:4)  )No results found for this or  any previous visit (from the past 240 hour(s)).    Studies: Dg Chest 2 View  Result Date: 08/09/2017 CLINICAL DATA:  Abdominal and chest pain EXAM: CHEST - 2 VIEW COMPARISON:  10/21/2016 FINDINGS: Cardiac shadows within normal limits. The lungs are well aerated bilaterally. Calcified granuloma is noted in the left base. Old rib fractures are seen. No acute infiltrate or sizable effusion is noted. IMPRESSION: No active cardiopulmonary disease. Electronically Signed   By: Inez Catalina M.D.   On: 08/09/2017 13:52   Ct Abdomen Pelvis W Contrast  Result Date: 08/09/2017 CLINICAL DATA:  77 year old female with a history abdominal pain and nausea/vomiting EXAM: CT ABDOMEN AND PELVIS WITH CONTRAST TECHNIQUE:  Multidetector CT imaging of the abdomen and pelvis was performed using the standard protocol following bolus administration of intravenous contrast. CONTRAST:  153mL ISOVUE-300 IOPAMIDOL (ISOVUE-300) INJECTION 61% COMPARISON:  06/07/2016 FINDINGS: Lower chest: No acute abnormality. Hepatobiliary: Unremarkable appearance of liver parenchyma. Cholecystectomy. Mild left-sided and right-sided intrahepatic ductal dilatation. The greatest diameter of the common bile duct measures 10 mm-11 mm which is slightly increased from the comparison CT. No radiopaque stones. Pancreas: Unremarkable appearance of the pancreas with mild prominence of the pancreatic duct. Spleen: Unremarkable spleen Adrenals/Urinary Tract: Unremarkable adrenal glands. Kidneys without hydronephrosis. No perinephric stranding. Symmetric perfusion. Unremarkable course the bilateral ureters. Urinary bladder is partially distended. There is urinary bladder wall thickening at the dome, with extension of a low-density tract along the expected course of the urachal remnant. There is mild inflammation at this site. Stomach/Bowel: Unremarkable appearance of the stomach. Unremarkable small bowel. No transition point. No focal wall thickening of small bowel. No transition point identified. No significant inflammatory changes of the mesenteric. Normal appendix. Mild stool burden of colon. No focal inflammatory changes. No transition point diverticular disease without associated inflammation. Vascular/Lymphatic: Vascular calcifications. Mesenteric arteries, bilateral renal arteries, bilateral iliac arteries and common femoral arteries patent. No retroperitoneal, abdominal, mesenteric adenopathy. Reproductive: Hysterectomy Other: No ventral wall hernia. Musculoskeletal: No acute displaced fracture. Multilevel degenerative changes of the spine. Degenerative changes are most pronounced at L2-L3, L3-L4, L5-S1 where there are significant endplate changes and vacuum disc  phenomenon. Similar appearance of grade 1/grade 2 anterolisthesis of L4 on L5. Degenerative changes of the hips. L2 intravertebral hemangioma IMPRESSION: Inflammatory changes of the dome of the urinary bladder with what appears to be a urachal remnant and soft tissue thickening. Differential diagnosis includes both inflammatory/infectious cystitis, as well as carcinoma, which can be harbored within urachal remnant. Correlation with urinalysis may be useful as well as urine cytology and referral for urology evaluation. Mild intrahepatic and extrahepatic biliary ductal dilatation in this patient status post cholecystectomy. No radiopaque stones are identified, and the changes may be chronic. Correlation with lab values may be useful. Diverticular disease without evidence of acute diverticulitis. Aortic Atherosclerosis (ICD10-I70.0). Electronically Signed   By: Corrie Mckusick D.O.   On: 08/09/2017 14:50    Scheduled Meds: . amLODipine  5 mg Oral Daily  . atorvastatin  80 mg Oral Daily  . calcium carbonate  1 tablet Oral Daily  . donepezil  5 mg Oral BID  . FLUoxetine  20 mg Oral Daily  . loratadine  10 mg Oral Daily  . magnesium gluconate  500 mg Oral Daily  . metoprolol succinate  50 mg Oral Daily  . multivitamin  1 tablet Oral Daily  . sodium chloride flush  3 mL Intravenous Q12H    Continuous Infusions: . sodium chloride  LOS: 1 day     Desiree Hane, MD [facility] Pager [phone]  If 7PM-7AM, please contact night-coverage [email or web address] Password [facility] 08/10/2017, 11:34 AM

## 2017-08-11 ENCOUNTER — Encounter (HOSPITAL_COMMUNITY): Payer: Self-pay | Admitting: Gastroenterology

## 2017-08-11 DIAGNOSIS — N390 Urinary tract infection, site not specified: Secondary | ICD-10-CM | POA: Diagnosis present

## 2017-08-11 DIAGNOSIS — F329 Major depressive disorder, single episode, unspecified: Secondary | ICD-10-CM | POA: Diagnosis not present

## 2017-08-11 DIAGNOSIS — K920 Hematemesis: Secondary | ICD-10-CM | POA: Diagnosis not present

## 2017-08-11 DIAGNOSIS — R10819 Abdominal tenderness, unspecified site: Secondary | ICD-10-CM | POA: Diagnosis not present

## 2017-08-11 MED ORDER — CEPHALEXIN 500 MG PO CAPS: 500.0000 mg | ORAL_CAPSULE | Freq: Two times a day (BID) | ORAL | 0 refills | Status: AC

## 2017-08-11 MED ORDER — FLUOXETINE HCL 20 MG PO CAPS
20.0000 mg | ORAL_CAPSULE | Freq: Once | ORAL | Status: AC
  Administered 2017-08-11: 20 mg via ORAL
  Filled 2017-08-11: qty 1

## 2017-08-11 MED ORDER — FLUOXETINE HCL 20 MG PO CAPS: 40.0000 mg | ORAL_CAPSULE | Freq: Every day | ORAL | Status: DC

## 2017-08-11 MED ORDER — TRAZODONE HCL 50 MG PO TABS
50.0000 mg | ORAL_TABLET | Freq: Once | ORAL | Status: AC
  Administered 2017-08-11: 50 mg via ORAL
  Filled 2017-08-11: qty 1

## 2017-08-11 MED ORDER — SODIUM CHLORIDE 0.9 % IV SOLN
1.0000 g | INTRAVENOUS | Status: DC
  Filled 2017-08-11: qty 10

## 2017-08-11 MED ORDER — CEPHALEXIN 500 MG PO CAPS
500.0000 mg | ORAL_CAPSULE | Freq: Two times a day (BID) | ORAL | Status: DC
  Administered 2017-08-11: 500 mg via ORAL
  Filled 2017-08-11: qty 1

## 2017-08-11 NOTE — Care Management CC44 (Signed)
Condition Code 44 Documentation Completed  Patient Details  Name: ZANARIA MORELL MRN: [id number] Date of Birth: 03-25-1940   Condition Code 44 given:  Yes Patient signature on Condition Code 44 notice:  Yes Documentation of 2 MD's agreement:  Yes Code 44 added to claim:  Yes    Leeroy Cha, RN 08/11/2017, 12:26 PM

## 2017-08-11 NOTE — Discharge Summary (Signed)
Discharge Summary  Sherri Dominguez PNT:[id number] DOB: 08/15/1940  PCP: Lorelei Pont, MD  Admit date: 08/09/2017 Discharge date: 08/11/2017   Time spent: < 25 minutes  Admitted From: Home Disposition: Home  Recommendations for Outpatient Follow-up:  1. Follow up with PCP in 1 to 2 weeks 2. Keflex twice daily x7 total days 3. Consider repeat abdominal imaging to assess urinary bladder after treatment of UTI as outpatient. 4. GI follow-up as outpatient     Discharge Diagnoses:  Active Hospital Problems   Diagnosis Date Noted  . Hematemesis 08/09/2017  . Acute lower UTI 08/11/2017  . Abnormal finding on urinalysis 08/10/2017  . LUQ pain 08/10/2017  . Suprapubic tenderness 08/10/2017  . Essential hypertension, benign 01/02/2007  . Constipation 01/02/2007  . Depression 01/02/2007    Resolved Hospital Problems   Diagnosis Date Noted Date Resolved  . Hyperkalemia 08/10/2017 08/10/2017  . Upper GI bleed  08/10/2017    Discharge Condition: Stable  CODE STATUS: Full code   History of present illness:  Sherri Dominguez is a 77 y.o. year old female with medical history significant for HTN, CVA, depression who presented on 08/09/2017 with reports of vomiting blood worsening left-sided abdominal pain.  Patient states over the last several weeks she has had intermittent episodes of dry heaves and vomiting with no clear association to eating.  She also has associated left-sided abdominal pain with these episodes.  She reports noticing blood in her vomitus on multiple occasions with last episode prior to this admission.  Because of worsening pain and persistent nausea vomiting patient presented to urgent care center who directed her to emergency department at [facility].  Denies any NSAID use, diarrhea, melena, chest pain, shortness of breath, loss of appetite.  Reported 5 pound weight loss in the past few months.  Of note, patient recently lost her husband.   Hospital Course:    Reported hematemesis, resolved -Status post EGD on 7/28 which was unremarkable -Patient had no episodes of emesis during hospital observation -Hemoglobin remained stable-patient continue oral Protonix on discharge -GI plans to follow-up as outpatient -Patient tolerated regular diet  Abdominal pain, improved UTI -CT abdomen with inflammatory changes of urinary bladder, differential diagnoses include inflammatory/infectious cystitis as well as carcinoma -Initial UA shows significant red discoloration, repeat UA was positive for UTI -Patient was started on Keflex on 7/29, will continue for 7 total days of therapy as outpatient-abdominal pain improved with treatment of UTI - Patient will need repeat imaging to ensure improvement in inflammatory changes  Hyperkalemia, resolved - 5.2 on admission, within normal limits throughout rest of hospital observation  Depression, stable Continue home Prozac  Dementia, stable -Alert and oriented to person place time and context -Understands medical plan -Continue Aricept  Consultations:  Gastroenterology  Procedures/Studies: 7/28: EGD Findings: The stomach was normal. The cardia and gastric fundus were normal on retroflexion. The examined duodenum was normal. - Normal esophagus. - Normal stomach. - Normal examined duodenum. - No specimens collected. No cause for symptoms was seen. CTAP scan unrevealing for symptoms as well. Hematuria and bladder dome abnormality on CT scan to be investigated by urology  Discharge Exam: BP 132/78 (BP Location: Right Arm)   Pulse (!) 57   Temp 97.9 F (36.6 C) (Oral)   Resp 18   Ht 5\' 5"  (1.651 m)   Wt 61.2 kg (135 lb)   SpO2 95%   BMI 22.47 kg/m   General: Lying in bed, no apparent distress Eyes: EOMI, anicteric ENT: Oral  Mucosa clear and moist Cardiovascular: regular rate and rhythm, no murmurs, rubs or gallops, no edema, Respiratory: Normal respiratory effort, lungs clear to auscultation  bilaterally Abdomen: soft, non-distended, non-tender, normal bowel sounds Skin: No Rash Neurologic: Grossly no focal neuro deficit.Mental status AAOx3, speech normal, Psychiatric:Appropriate affect, and mood   Discharge Instructions You were cared for by a hospitalist during your hospital stay. If you have any questions about your discharge medications or the care you received while you were in the hospital after you are discharged, you can call the unit and asked to speak with the hospitalist on call if the hospitalist that took care of you is not available. Once you are discharged, your primary care physician will handle any further medical issues. Please note that NO REFILLS for any discharge medications will be authorized once you are discharged, as it is imperative that you return to your primary care physician (or establish a relationship with a primary care physician if you do not have one) for your aftercare needs so that they can reassess your need for medications and monitor your lab values.  Discharge Instructions    Diet - low sodium heart healthy   Complete by:  As directed    Increase activity slowly   Complete by:  As directed      Allergies as of 08/11/2017      Reactions   Codeine Other (See Comments)   Pt does not remember what happens when taking this      Medication List    TAKE these medications   amLODipine 5 MG tablet Commonly known as:  NORVASC Take 5 mg by mouth daily.   atorvastatin 80 MG tablet Commonly known as:  LIPITOR Take 80 mg by mouth daily.   calcium carbonate 200 MG capsule Take 250 mg by mouth daily.   cephALEXin 500 MG capsule Commonly known as:  KEFLEX Take 1 capsule (500 mg total) by mouth every 12 (twelve) hours for 13 doses.   D-3-5 5000 units capsule Generic drug:  Cholecalciferol Take 5,000 Units by mouth daily.   donepezil 5 MG tablet Commonly known as:  ARICEPT Take 5 mg by mouth 2 (two) times daily.   fexofenadine 180 MG  tablet Commonly known as:  ALLEGRA Take 180 mg by mouth daily.   FLUoxetine 40 MG capsule Commonly known as:  PROZAC Take 40 mg by mouth daily.   ICAPS PO Take 1 capsule by mouth daily.   magnesium 30 MG tablet Take 30 mg by mouth daily.   metoprolol succinate 50 MG 24 hr tablet Commonly known as:  TOPROL-XL Take 50 mg by mouth daily. Take with or immediately following a meal.   vitamin B-12 1000 MCG tablet Commonly known as:  CYANOCOBALAMIN Take 1,000 mcg by mouth daily.      Allergies  Allergen Reactions  . Codeine Other (See Comments)    Pt does not remember what happens when taking this      The results of significant diagnostics from this hospitalization (including imaging, microbiology, ancillary and laboratory) are listed below for reference.    Significant Diagnostic Studies: Dg Chest 2 View  Result Date: 08/09/2017 CLINICAL DATA:  Abdominal and chest pain EXAM: CHEST - 2 VIEW COMPARISON:  10/21/2016 FINDINGS: Cardiac shadows within normal limits. The lungs are well aerated bilaterally. Calcified granuloma is noted in the left base. Old rib fractures are seen. No acute infiltrate or sizable effusion is noted. IMPRESSION: No active cardiopulmonary disease. Electronically Signed   By:  Inez Catalina M.D.   On: 08/09/2017 13:52   Ct Abdomen Pelvis W Contrast  Result Date: 08/09/2017 CLINICAL DATA:  77 year old female with a history abdominal pain and nausea/vomiting EXAM: CT ABDOMEN AND PELVIS WITH CONTRAST TECHNIQUE: Multidetector CT imaging of the abdomen and pelvis was performed using the standard protocol following bolus administration of intravenous contrast. CONTRAST:  138mL ISOVUE-300 IOPAMIDOL (ISOVUE-300) INJECTION 61% COMPARISON:  06/07/2016 FINDINGS: Lower chest: No acute abnormality. Hepatobiliary: Unremarkable appearance of liver parenchyma. Cholecystectomy. Mild left-sided and right-sided intrahepatic ductal dilatation. The greatest diameter of the common  bile duct measures 10 mm-11 mm which is slightly increased from the comparison CT. No radiopaque stones. Pancreas: Unremarkable appearance of the pancreas with mild prominence of the pancreatic duct. Spleen: Unremarkable spleen Adrenals/Urinary Tract: Unremarkable adrenal glands. Kidneys without hydronephrosis. No perinephric stranding. Symmetric perfusion. Unremarkable course the bilateral ureters. Urinary bladder is partially distended. There is urinary bladder wall thickening at the dome, with extension of a low-density tract along the expected course of the urachal remnant. There is mild inflammation at this site. Stomach/Bowel: Unremarkable appearance of the stomach. Unremarkable small bowel. No transition point. No focal wall thickening of small bowel. No transition point identified. No significant inflammatory changes of the mesenteric. Normal appendix. Mild stool burden of colon. No focal inflammatory changes. No transition point diverticular disease without associated inflammation. Vascular/Lymphatic: Vascular calcifications. Mesenteric arteries, bilateral renal arteries, bilateral iliac arteries and common femoral arteries patent. No retroperitoneal, abdominal, mesenteric adenopathy. Reproductive: Hysterectomy Other: No ventral wall hernia. Musculoskeletal: No acute displaced fracture. Multilevel degenerative changes of the spine. Degenerative changes are most pronounced at L2-L3, L3-L4, L5-S1 where there are significant endplate changes and vacuum disc phenomenon. Similar appearance of grade 1/grade 2 anterolisthesis of L4 on L5. Degenerative changes of the hips. L2 intravertebral hemangioma IMPRESSION: Inflammatory changes of the dome of the urinary bladder with what appears to be a urachal remnant and soft tissue thickening. Differential diagnosis includes both inflammatory/infectious cystitis, as well as carcinoma, which can be harbored within urachal remnant. Correlation with urinalysis may be useful  as well as urine cytology and referral for urology evaluation. Mild intrahepatic and extrahepatic biliary ductal dilatation in this patient status post cholecystectomy. No radiopaque stones are identified, and the changes may be chronic. Correlation with lab values may be useful. Diverticular disease without evidence of acute diverticulitis. Aortic Atherosclerosis (ICD10-I70.0). Electronically Signed   By: Corrie Mckusick D.O.   On: 08/09/2017 14:50    Microbiology: No results found for this or any previous visit (from the past 240 hour(s)).   Labs: Basic Metabolic Panel: Recent Labs  Lab 08/09/17 1235 08/09/17 1835 08/10/17 0459  NA 132* 134* 133*  K 5.2* 4.3 4.4  CL 100 104 101  CO2 20* 20* 21*  GLUCOSE 96 97 87  BUN 13 11 13   CREATININE 0.70 0.59 0.70  CALCIUM 9.3 9.2 9.2   Liver Function Tests: Recent Labs  Lab 08/09/17 1235  AST 26  ALT 12  ALKPHOS 78  BILITOT 0.7  PROT 7.9  ALBUMIN 3.9   Recent Labs  Lab 08/09/17 1235  LIPASE 34   No results for input(s): AMMONIA in the last 168 hours. CBC: Recent Labs  Lab 08/09/17 1235 08/10/17 0459  WBC 10.1 6.7  HGB 12.1 11.4*  HCT 36.2 35.3*  MCV 90.3 91.7  PLT 280 267   Cardiac Enzymes: Recent Labs  Lab 08/10/17 1330  CKTOTAL 63   BNP: BNP (last 3 results) No results for input(s):  BNP in the last 8760 hours.  ProBNP (last 3 results) No results for input(s): PROBNP in the last 8760 hours.  CBG: No results for input(s): GLUCAP in the last 168 hours.     Signed:  Desiree Hane, MD [facility] 08/11/2017, 11:44 AM

## 2017-08-14 LAB — URINE CULTURE

## 2020-02-11 IMAGING — CT CT ABD-PELV W/ CM
2 of 5 series · 16 of 46 positions shown, 18 images · IV contrast (ISOVUE)
Comparison: 06/07/2016

CLINICAL DATA: 76-year-old female with a history abdominal pain and
nausea/vomiting

EXAM:
CT ABDOMEN AND PELVIS WITH CONTRAST
TECHNIQUE: Multidetector CT imaging of the abdomen and pelvis was performed
using the standard protocol following bolus administration of
intravenous contrast.
CONTRAST:  100mL VDRFEQ-MNN IOPAMIDOL (VDRFEQ-MNN) INJECTION 61%

[Series 2: axial st · axial · 0.77mm/px · z∈[+1035,+1385]mm · 13 of 82 slices shown, 15 images]
[im 6/82  soft-tissue]
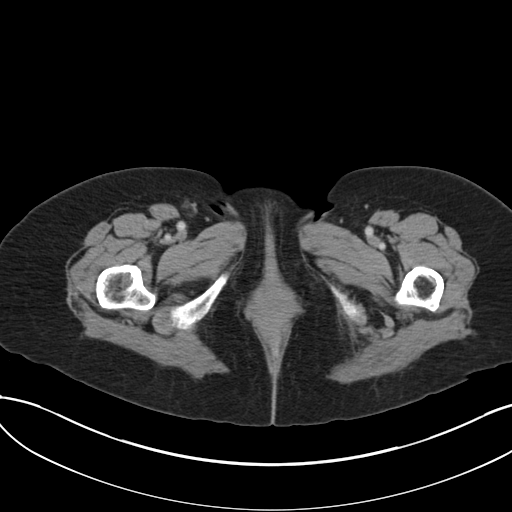
[im 6/82  bone]
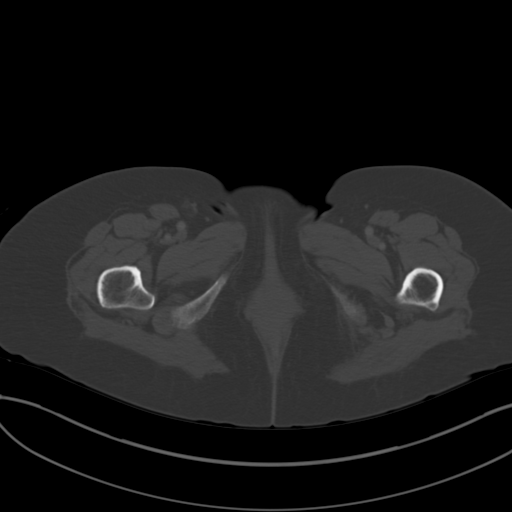
[im 11/82  soft-tissue]
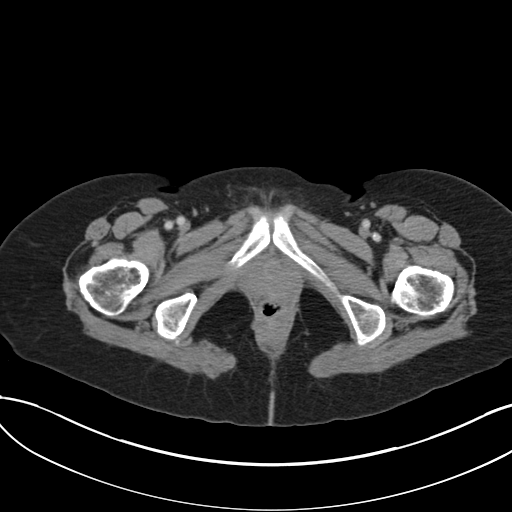
[im 17/82  soft-tissue]
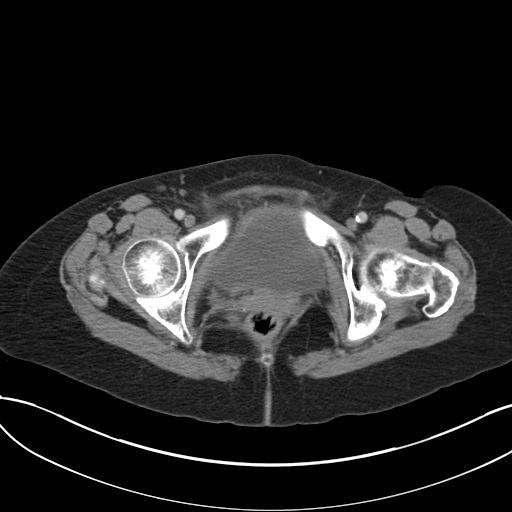
[im 22/82  soft-tissue]
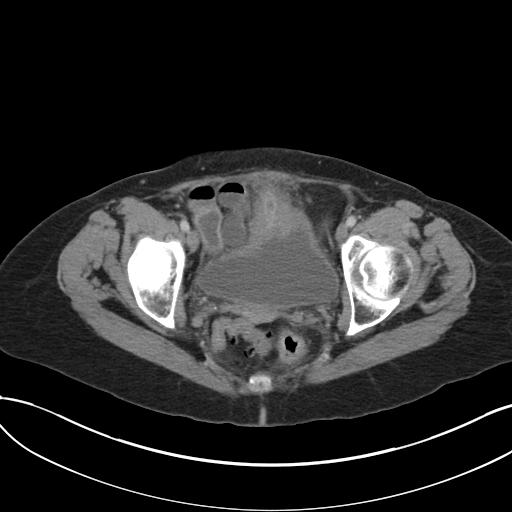
[im 28/82  soft-tissue]
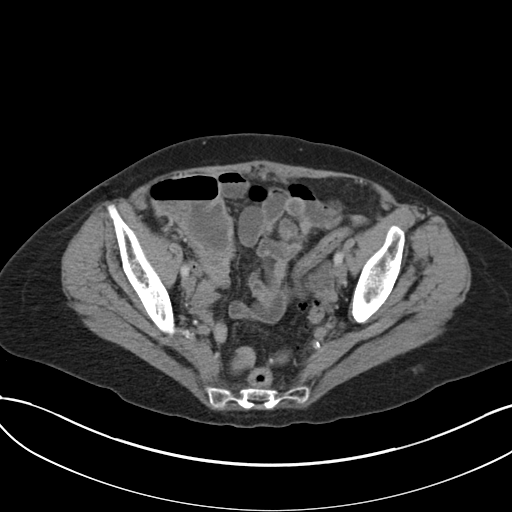
[im 33/82  soft-tissue]
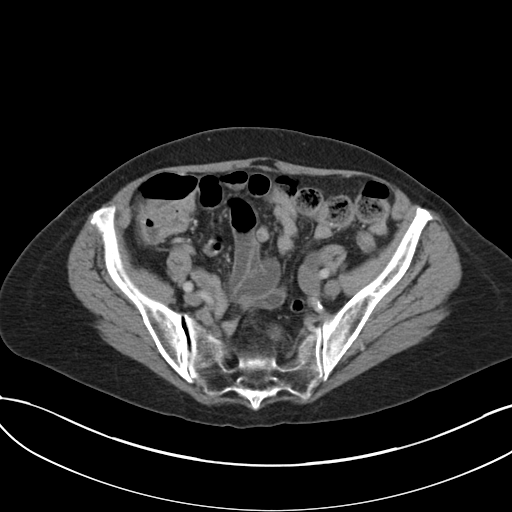
[im 44/82  soft-tissue]
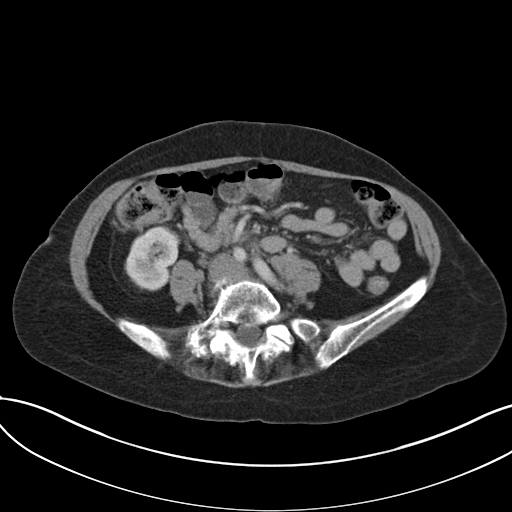
[im 49/82  soft-tissue]
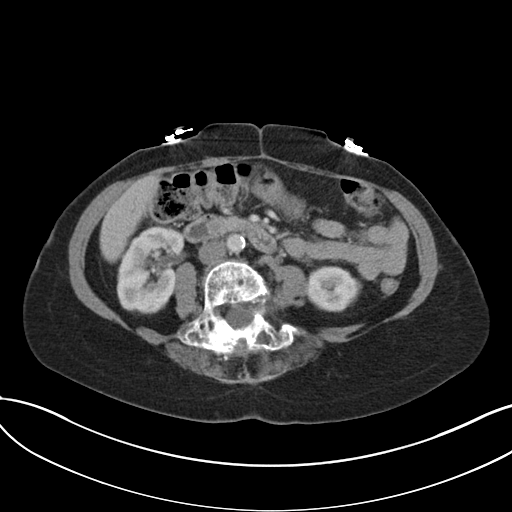
[im 55/82  soft-tissue]
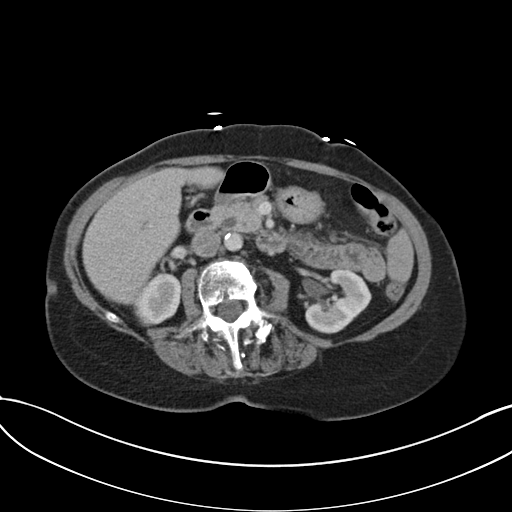
[im 55/82  bone]
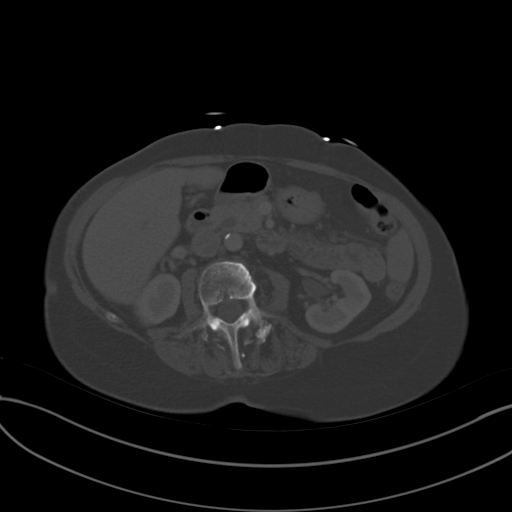
[im 60/82  soft-tissue]
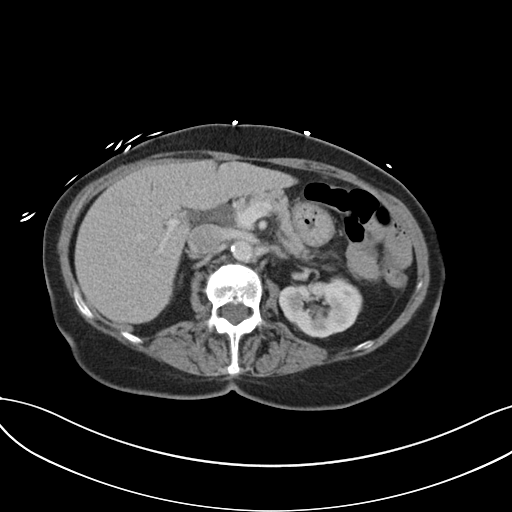
[im 65/82  soft-tissue]
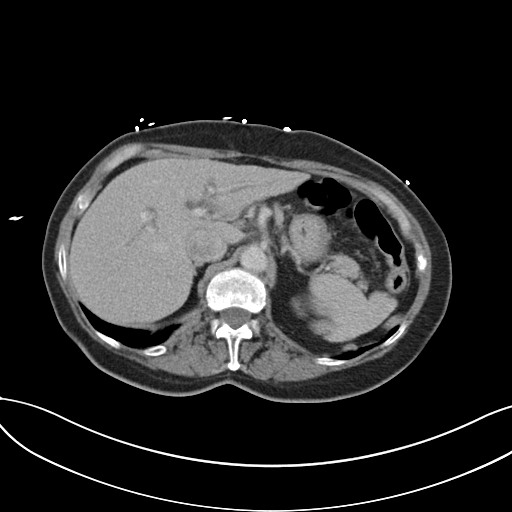
[im 71/82  soft-tissue]
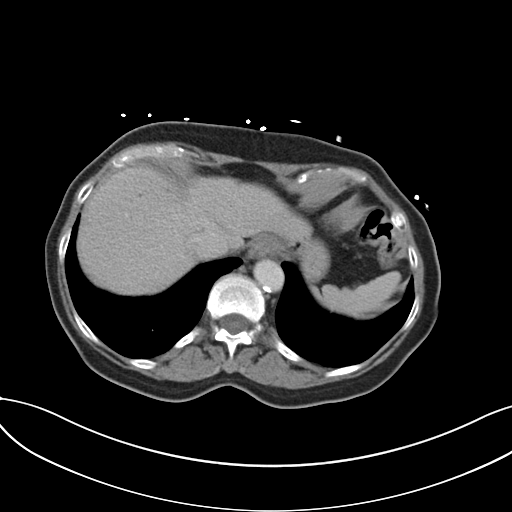
[im 76/82  soft-tissue]
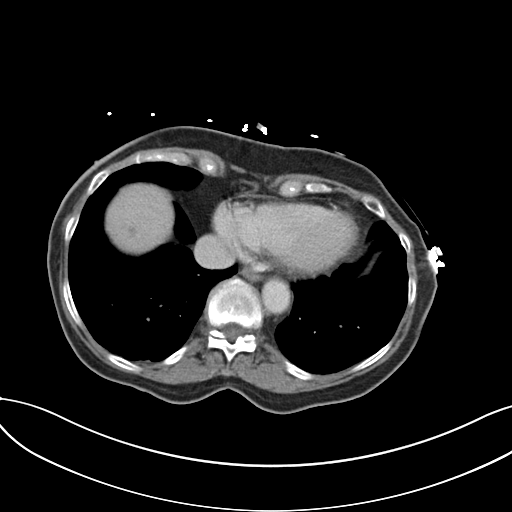

[Series 6: coronal st · coronal · 0.78mm/px · 3 of 101 slices shown]
[im 34/101  soft-tissue]
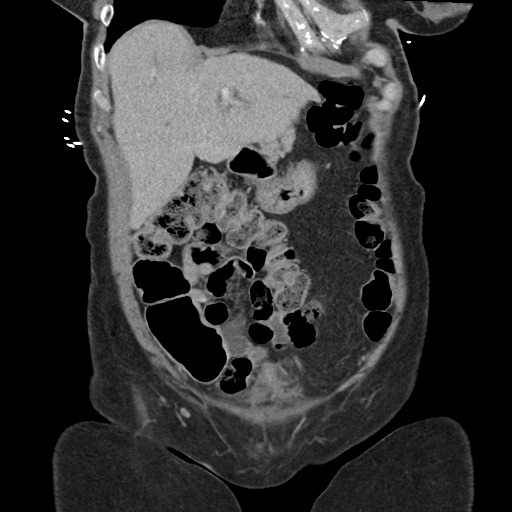
[im 45/101  soft-tissue]
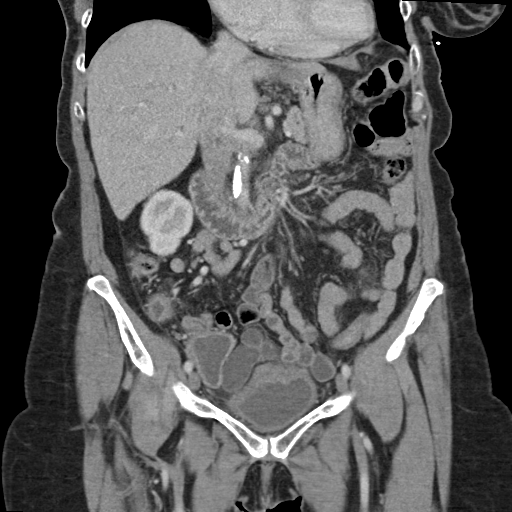
[im 56/101  soft-tissue]
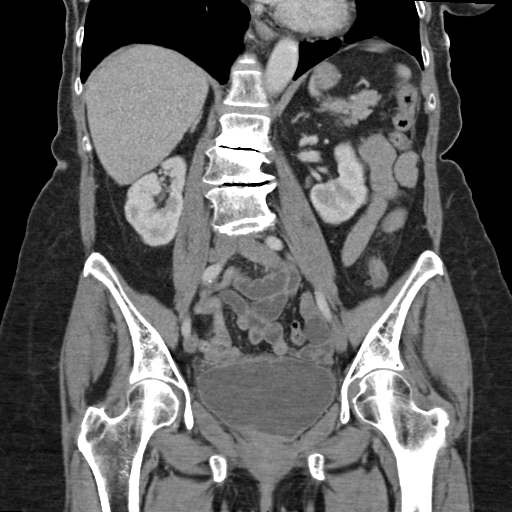

[16 of 46 positions shown; findings below may reference images not displayed]

FINDINGS: Lower chest: No acute abnormality.

Hepatobiliary: Unremarkable appearance of liver parenchyma.

Cholecystectomy. Mild left-sided and right-sided intrahepatic ductal
dilatation. The greatest diameter of the common bile duct measures
10 mm-44 mm which is slightly increased from the comparison CT. No
radiopaque stones.

Pancreas: Unremarkable appearance of the pancreas with mild
prominence of the pancreatic duct.

Spleen: Unremarkable spleen

Adrenals/Urinary Tract: Unremarkable adrenal glands.

Kidneys without hydronephrosis. No perinephric stranding. Symmetric
perfusion. Unremarkable course the bilateral ureters.

Urinary bladder is partially distended. There is urinary bladder
wall thickening at the dome, with extension of a low-density tract
along the expected course of the urachal remnant. There is mild
inflammation at this site.

Stomach/Bowel: Unremarkable appearance of the stomach. Unremarkable
small bowel. No transition point. No focal wall thickening of small
bowel. No transition point identified. No significant inflammatory
changes of the mesenteric. Normal appendix. Mild stool burden of
colon. No focal inflammatory changes. No transition point
diverticular disease without associated inflammation.

Vascular/Lymphatic: Vascular calcifications. Mesenteric arteries,
bilateral renal arteries, bilateral iliac arteries and common
femoral arteries patent.

No retroperitoneal, abdominal, mesenteric adenopathy.

Reproductive: Hysterectomy

Other: No ventral wall hernia.

Musculoskeletal: No acute displaced fracture. Multilevel
degenerative changes of the spine. Degenerative changes are most
pronounced at L2-L3, L3-L4, L5-S1 where there are significant
endplate changes and vacuum disc phenomenon. Similar appearance of
grade 1/grade 2 anterolisthesis of L4 on L5. Degenerative changes of
the hips.

L2 intravertebral hemangioma
IMPRESSION: Inflammatory changes of the dome of the urinary bladder with what
appears to be a urachal remnant and soft tissue thickening.
Differential diagnosis includes both inflammatory/infectious
cystitis, as well as carcinoma, which can be harbored within urachal
remnant. Correlation with urinalysis may be useful as well as urine
cytology and referral for urology evaluation.

Mild intrahepatic and extrahepatic biliary ductal dilatation in this
patient status post cholecystectomy. No radiopaque stones are
identified, and the changes may be chronic. Correlation with lab
values may be useful.

Diverticular disease without evidence of acute diverticulitis.

Aortic Atherosclerosis ([id number]-[id number].A).

## 2020-02-11 IMAGING — DX DG CHEST 2V
1 series · 2 of 2 positions shown · non-contrast
Comparison: 10/21/2016

CLINICAL DATA: Abdominal and chest pain

EXAM:
CHEST - 2 VIEW

[Series 2: chest lat · 0.14mm/px · 2 of 2 slices shown]
[im 1/2]
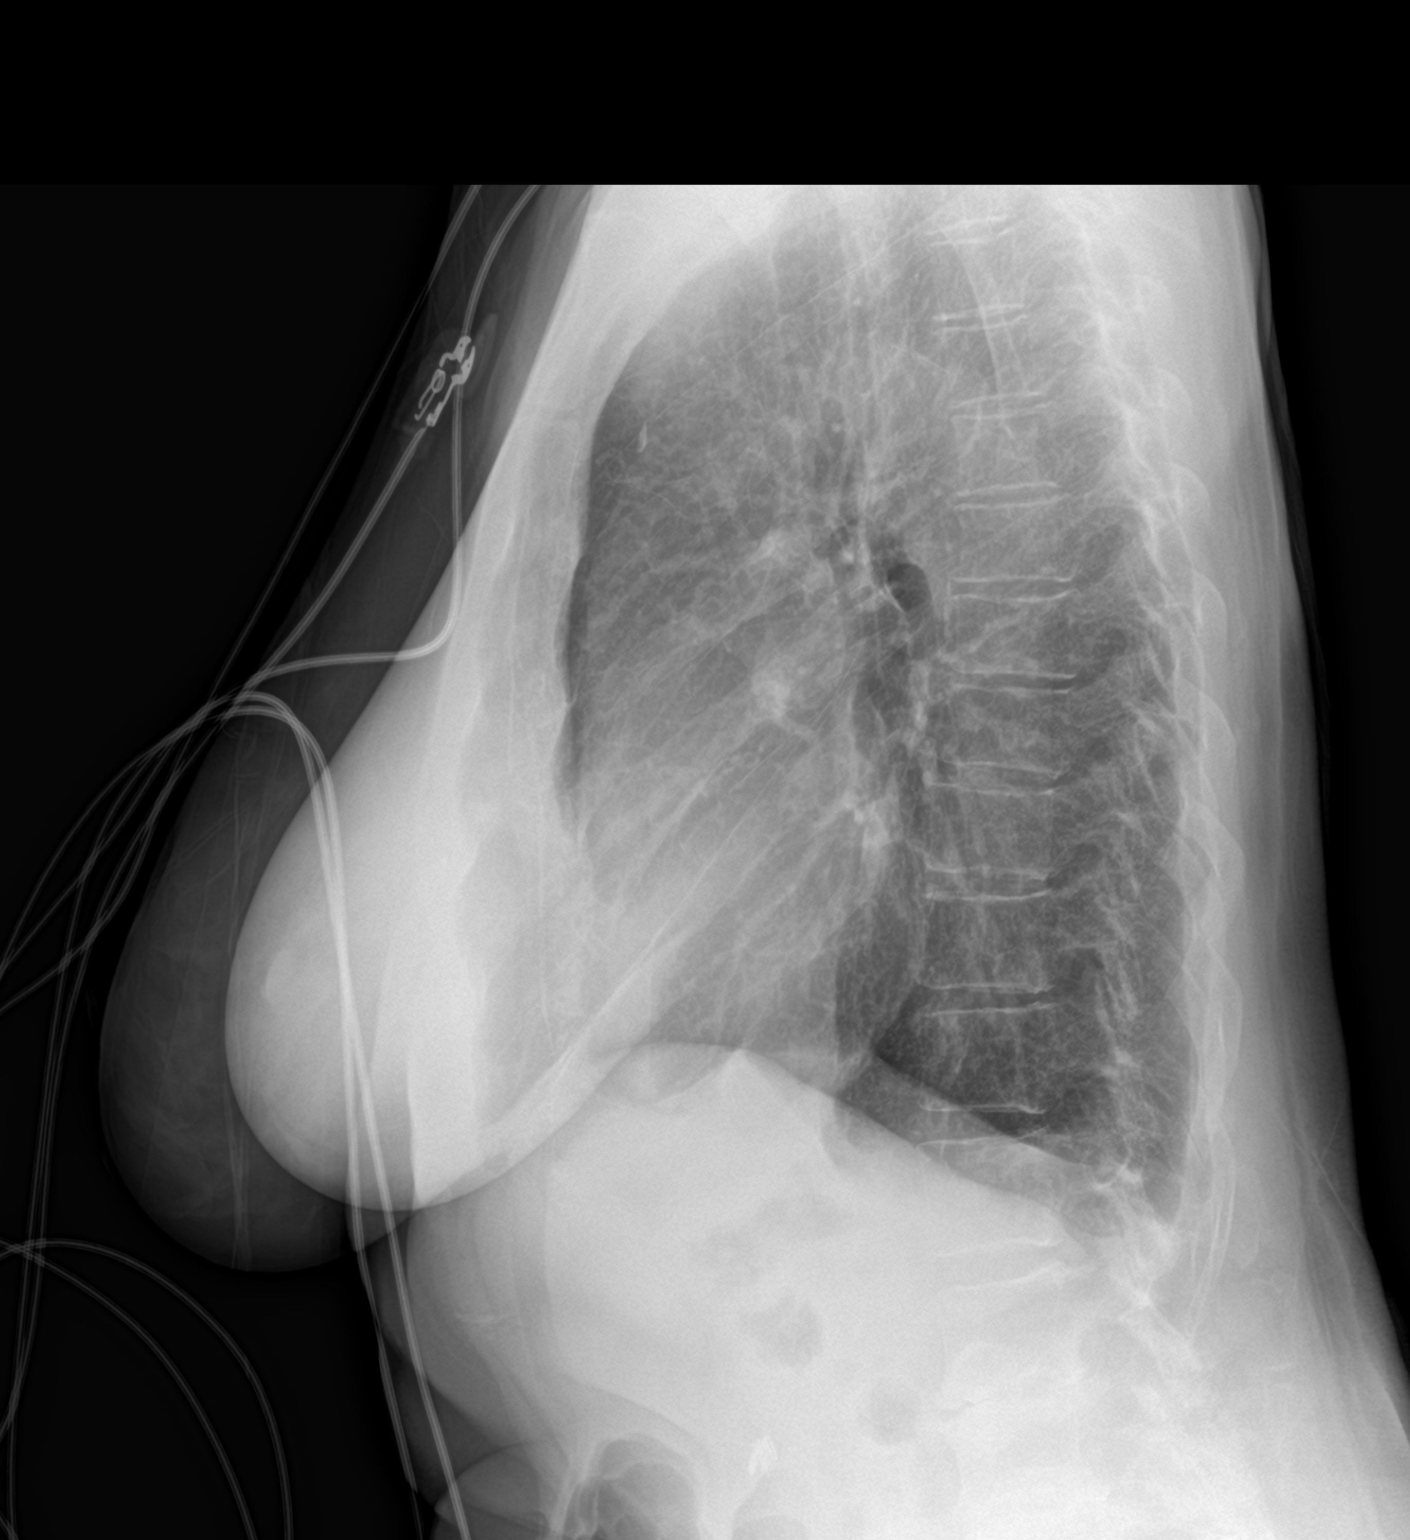
[im 2/2]
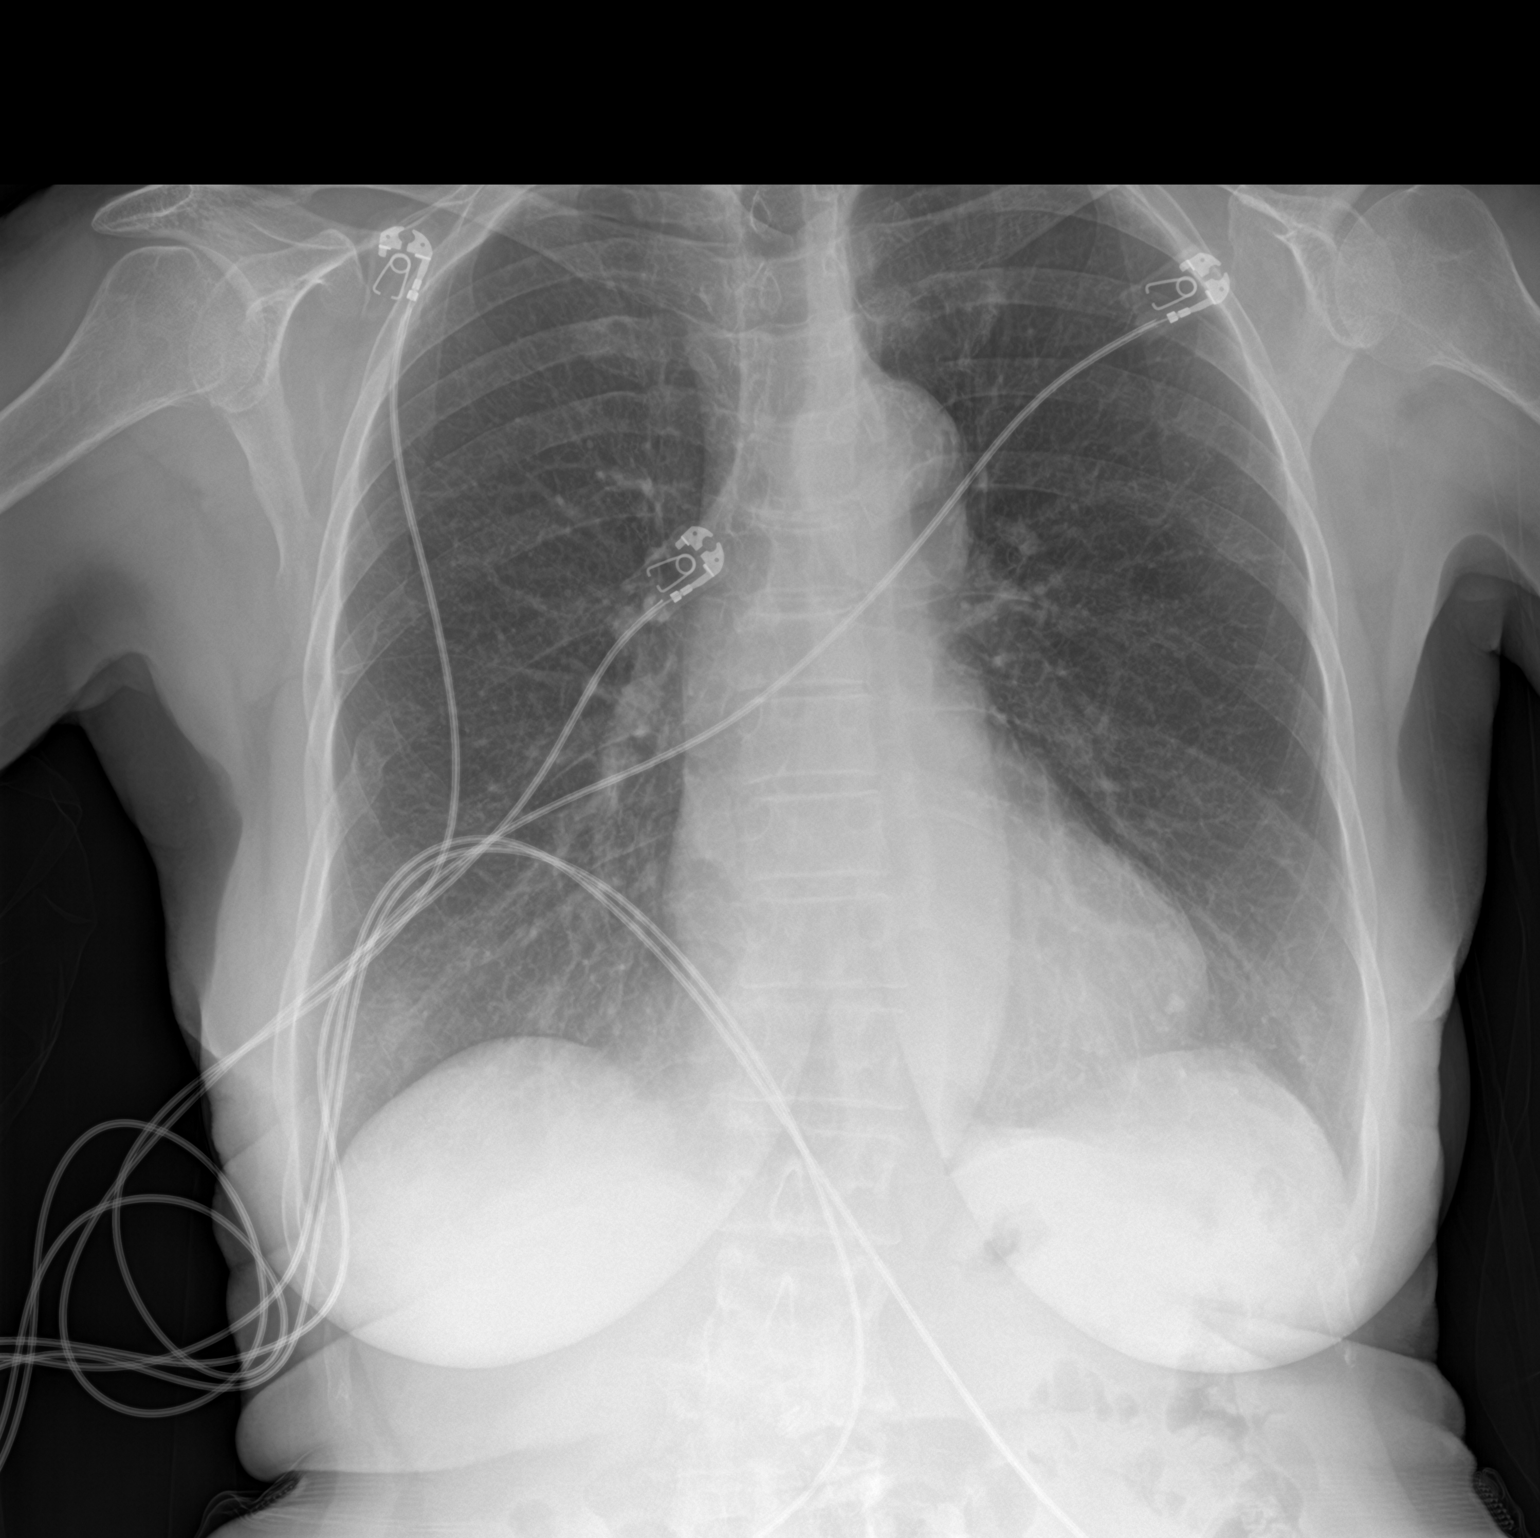

[2 of 2 positions shown; findings below may reference images not displayed]

FINDINGS: Cardiac shadows within normal limits. The lungs are well aerated
bilaterally. Calcified granuloma is noted in the left base. Old rib
fractures are seen. No acute infiltrate or sizable effusion is
noted.
IMPRESSION: No active cardiopulmonary disease.
